# Patient Record
Sex: Male | Born: 1985 | Race: White | Hispanic: No | Marital: Single | Smoking: Former smoker
Health system: Southern US, Community
[De-identification: ages and names within clinical notes are randomized; demographics above are authoritative.]

---
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
## 2005-07-07 ENCOUNTER — Encounter: Payer: Self-pay | Admitting: *Deleted

## 2009-09-08 ENCOUNTER — Emergency Department (HOSPITAL_COMMUNITY): Admission: EM | Admit: 2009-09-08 | Discharge: 2009-09-08 | Payer: Self-pay | Admitting: Emergency Medicine

## 2010-06-01 ENCOUNTER — Emergency Department (HOSPITAL_COMMUNITY): Payer: No Typology Code available for payment source

## 2010-06-01 ENCOUNTER — Emergency Department (HOSPITAL_COMMUNITY)
Admission: EM | Admit: 2010-06-01 | Discharge: 2010-06-01 | Disposition: A | Discharge status: Home / Self Care | Payer: No Typology Code available for payment source | Attending: Emergency Medicine | Admitting: Emergency Medicine

## 2010-06-01 DIAGNOSIS — R071 Chest pain on breathing: Secondary | ICD-10-CM | POA: Insufficient documentation

## 2010-06-01 DIAGNOSIS — IMO0002 Reserved for concepts with insufficient information to code with codable children: Secondary | ICD-10-CM | POA: Insufficient documentation

## 2010-06-01 DIAGNOSIS — IMO0001 Reserved for inherently not codable concepts without codable children: Secondary | ICD-10-CM | POA: Insufficient documentation

## 2010-06-01 DIAGNOSIS — M25519 Pain in unspecified shoulder: Secondary | ICD-10-CM | POA: Insufficient documentation

## 2010-06-01 DIAGNOSIS — T148XXA Other injury of unspecified body region, initial encounter: Secondary | ICD-10-CM | POA: Insufficient documentation

## 2010-06-01 DIAGNOSIS — R0789 Other chest pain: Secondary | ICD-10-CM | POA: Insufficient documentation

## 2010-06-24 ENCOUNTER — Other Ambulatory Visit: Payer: Self-pay | Admitting: Orthopedic Surgery

## 2010-06-24 DIAGNOSIS — R52 Pain, unspecified: Secondary | ICD-10-CM

## 2010-06-25 ENCOUNTER — Inpatient Hospital Stay: Admission: RE | Admit: 2010-06-25 | Payer: No Typology Code available for payment source | Source: Ambulatory Visit

## 2010-06-26 ENCOUNTER — Other Ambulatory Visit: Payer: No Typology Code available for payment source

## 2010-07-08 ENCOUNTER — Ambulatory Visit
Admission: RE | Admit: 2010-07-08 | Discharge: 2010-07-08 | Disposition: A | Discharge status: Home / Self Care | Payer: Federal, State, Local not specified - PPO | Source: Ambulatory Visit | Attending: Orthopedic Surgery | Admitting: Orthopedic Surgery

## 2010-07-08 DIAGNOSIS — R52 Pain, unspecified: Secondary | ICD-10-CM

## 2010-11-24 ENCOUNTER — Emergency Department (HOSPITAL_COMMUNITY)
Admission: EM | Admit: 2010-11-24 | Discharge: 2010-11-24 | Disposition: A | Discharge status: Home / Self Care | Payer: Federal, State, Local not specified - PPO | Attending: Emergency Medicine | Admitting: Emergency Medicine

## 2010-11-24 DIAGNOSIS — B029 Zoster without complications: Secondary | ICD-10-CM | POA: Insufficient documentation

## 2012-08-10 ENCOUNTER — Other Ambulatory Visit: Payer: Federal, State, Local not specified - PPO

## 2012-08-10 ENCOUNTER — Other Ambulatory Visit: Payer: Self-pay | Admitting: Family Medicine

## 2012-08-10 DIAGNOSIS — R109 Unspecified abdominal pain: Secondary | ICD-10-CM

## 2012-08-11 ENCOUNTER — Ambulatory Visit
Admission: RE | Admit: 2012-08-11 | Discharge: 2012-08-11 | Disposition: A | Discharge status: Home / Self Care | Payer: BC Managed Care – PPO | Source: Ambulatory Visit | Attending: Family Medicine | Admitting: Family Medicine

## 2012-08-11 DIAGNOSIS — R109 Unspecified abdominal pain: Secondary | ICD-10-CM

## 2017-12-02 ENCOUNTER — Ambulatory Visit: Payer: Self-pay | Admitting: Family Medicine

## 2018-01-21 ENCOUNTER — Encounter (HOSPITAL_COMMUNITY): Payer: Self-pay | Admitting: Emergency Medicine

## 2018-01-21 ENCOUNTER — Emergency Department (HOSPITAL_COMMUNITY): Payer: BLUE CROSS/BLUE SHIELD

## 2018-01-21 ENCOUNTER — Other Ambulatory Visit: Payer: Self-pay

## 2018-01-21 ENCOUNTER — Emergency Department (HOSPITAL_COMMUNITY)
Admission: EM | Admit: 2018-01-21 | Discharge: 2018-01-21 | Disposition: A | Discharge status: Home / Self Care | Payer: BLUE CROSS/BLUE SHIELD | Attending: Emergency Medicine | Admitting: Emergency Medicine

## 2018-01-21 DIAGNOSIS — R1031 Right lower quadrant pain: Secondary | ICD-10-CM | POA: Diagnosis not present

## 2018-01-21 DIAGNOSIS — M79604 Pain in right leg: Secondary | ICD-10-CM

## 2018-01-21 DIAGNOSIS — N200 Calculus of kidney: Secondary | ICD-10-CM | POA: Diagnosis not present

## 2018-01-21 LAB — URINALYSIS, ROUTINE W REFLEX MICROSCOPIC
Bilirubin Urine: NEGATIVE
GLUCOSE, UA: NEGATIVE mg/dL
HGB URINE DIPSTICK: NEGATIVE
KETONES UR: NEGATIVE mg/dL
LEUKOCYTES UA: NEGATIVE
Nitrite: NEGATIVE
PH: 6 (ref 5.0–8.0)
PROTEIN: NEGATIVE mg/dL
Specific Gravity, Urine: 1.015 (ref 1.005–1.030)

## 2018-01-21 MED ORDER — DEXAMETHASONE SODIUM PHOSPHATE 10 MG/ML IJ SOLN
10.0000 mg | Freq: Once | INTRAMUSCULAR | Status: AC
  Administered 2018-01-21: 10 mg via INTRAVENOUS
  Filled 2018-01-21: qty 1

## 2018-01-21 MED ORDER — KETOROLAC TROMETHAMINE 30 MG/ML IJ SOLN
30.0000 mg | Freq: Once | INTRAMUSCULAR | Status: AC
  Administered 2018-01-21: 30 mg via INTRAVENOUS
  Filled 2018-01-21: qty 1

## 2018-01-21 MED ORDER — PREDNISONE 20 MG PO TABS: 40.0000 mg | ORAL_TABLET | Freq: Every day | ORAL | 0 refills | Status: DC

## 2018-01-21 MED ORDER — FENTANYL CITRATE (PF) 100 MCG/2ML IJ SOLN
50.0000 ug | Freq: Once | INTRAMUSCULAR | Status: AC
  Administered 2018-01-21: 50 ug via INTRAVENOUS
  Filled 2018-01-21: qty 2

## 2018-01-21 MED ORDER — OXYCODONE-ACETAMINOPHEN 5-325 MG PO TABS
1.0000 | ORAL_TABLET | Freq: Once | ORAL | Status: AC
  Administered 2018-01-21: 1 via ORAL
  Filled 2018-01-21: qty 1

## 2018-01-21 MED ORDER — HYDROCODONE-ACETAMINOPHEN 5-325 MG PO TABS: 1.0000 | ORAL_TABLET | Freq: Four times a day (QID) | ORAL | 0 refills | Status: DC | PRN

## 2018-01-21 MED ORDER — SODIUM CHLORIDE 0.9 % IV BOLUS
1000.0000 mL | Freq: Once | INTRAVENOUS | Status: AC
  Administered 2018-01-21: 1000 mL via INTRAVENOUS

## 2018-01-21 NOTE — Discharge Instructions (Signed)
As discussed, your evaluation today has been largely reassuring.  But, it is important that you monitor your condition carefully, and do not hesitate to return to the ED if you develop new, or concerning changes in your condition. ? ?Otherwise, please follow-up with your physician for appropriate ongoing care. ? ?

## 2018-01-21 NOTE — ED Notes (Signed)
Pt transported to CT via stretcher bed at this time.  

## 2018-01-21 NOTE — ED Provider Notes (Signed)
[facility]-EMERGENCY DEPT Provider Note   CSN: [id number] Arrival date & time: 01/21/18  5188     History   Chief Complaint Chief Complaint  Patient presents with  . Back Pain    HPI Ronald Mahoney is a 32 y.o. male.  HPI Patient presents with right flank, inguinal pain. It is sore severe, and the patient is unable to obtain a position of comfort. Pain that radiates on the right leg, laterally. Patient is healthy, has no medical problems, states that he was well prior to the onset of illness which was about 1 day ago, but notably worse over the past day. No improvement with OTC anti-inflammatories. No urinary changes, scrotum pain, swelling. 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     Home Medications   No home meds  Past medical history: Kidney stone  Social history: Denies drug use, smoke cigarettes  Allergies   Patient has no known allergies.   Review of Systems Review of Systems  Constitutional:       Per HPI, otherwise negative  HENT:       Per HPI, otherwise negative  Respiratory:       Per HPI, otherwise negative  Cardiovascular:       Per HPI, otherwise negative  Gastrointestinal: Negative for vomiting.  Endocrine:       Negative aside from HPI  Genitourinary:       Neg aside from HPI   Musculoskeletal:       Per HPI, otherwise negative  Skin: Negative.   Neurological: Negative for syncope.     Physical Exam Updated Vital Signs BP (!) 152/64 (BP Location: Right Arm)   Pulse 61   Temp 98.9 F (37.2 C) (Oral)   Resp 17   Ht 6\' 5"  (1.956 m)   Wt 104.3 kg   SpO2 99%   BMI 27.27 kg/m   Physical Exam  Constitutional: He is oriented to person, place, and time. He appears well-developed. No distress.  Very uncomfortable appearing thin young male awake and alert  HENT:  Head: Normocephalic and atraumatic.  Eyes: Conjunctivae and EOM are normal.    Cardiovascular: Normal rate and regular rhythm.  Pulmonary/Chest: Effort normal. No stridor. No respiratory distress.  Abdominal: He exhibits no distension.    Musculoskeletal: He exhibits no edema.  Neurological: He is alert and oriented to person, place, and time.  She describes pain in the right leg, worse with motion, but does move the leg spontaneously, has no apparent range of motion limitation, nor any deformity.   Skin: Skin is warm and dry.  Psychiatric: He has a normal mood and affect.  Nursing note and vitals reviewed.    ED Treatments / Results  Labs (all labs ordered are listed, but only abnormal results are displayed) Labs Reviewed  URINALYSIS, ROUTINE W REFLEX MICROSCOPIC    Radiology Ct Renal Stone Study  Result Date: 01/21/2018 CLINICAL DATA:  Right-sided flank pain EXAM: CT ABDOMEN AND PELVIS WITHOUT CONTRAST TECHNIQUE: Multidetector CT imaging of the abdomen and pelvis was performed following the standard protocol without IV contrast. COMPARISON:  08/11/2012 FINDINGS: Lower chest: No acute abnormality. Hepatobiliary: No focal liver abnormality is seen. No gallstones, gallbladder wall thickening, or biliary dilatation. Pancreas: Unremarkable. No pancreatic ductal dilatation or surrounding inflammatory changes. Spleen: Normal in size without focal abnormality. Adrenals/Urinary Tract: Adrenal glands are within normal limits. Kidneys demonstrate evidence of a tiny nonobstructing right renal stone.  Ureters are within normal limits. Bladder is partially distended. Stomach/Bowel: The appendix is well visualized and within normal limits. No obstructive or inflammatory changes of the bowel are seen. No gastric abnormality is noted. Vascular/Lymphatic: No significant vascular findings are present. No enlarged abdominal or pelvic lymph nodes. Reproductive: Prostate is unremarkable. Other: No abdominal wall hernia or abnormality. No abdominopelvic ascites. Musculoskeletal: No acute  or significant osseous findings. IMPRESSION: Tiny nonobstructing right renal stone. Normal-appearing appendix. No other focal abnormality is seen. Electronically Signed   By: Alcide CleverMark  Lukens M.D.   On: 01/21/2018 10:31    Procedures Procedures (including critical care time)  Medications Ordered in ED Medications  sodium chloride 0.9 % bolus 1,000 mL (1,000 mLs Intravenous Bolus 01/21/18 1010)  ketorolac (TORADOL) 30 MG/ML injection 30 mg (30 mg Intravenous Given 01/21/18 1010)  fentaNYL (SUBLIMAZE) injection 50 mcg (50 mcg Intravenous Given 01/21/18 1011)  oxyCODONE-acetaminophen (PERCOCET/ROXICET) 5-325 MG per tablet 1 tablet (1 tablet Oral Given 01/21/18 1156)     Initial Impression / Assessment and Plan / ED Course  I have reviewed the triage vital signs and the nursing notes.  Pertinent labs & imaging results that were available during my care of the patient were reviewed by me and considered in my medical decision making (see chart for details).     Initial urinalysis reassuring, given the patient's history of kidney stone, CT scan performed  2:22 PM Vision is awake and alert, hemodynamically unremarkable, though he has some discomfort, he is better than on arrival. No new complaints, no fever CT notable for kidney stone without obstruction, infection. Patient has unusual description of pain for kidney stone, there is some suspicion for sciatic irritation, this may be secondary to the patient's occupation, moving heavy objects. Patient has an improvement here, will require additional analgesia on discharge, outpatient follow-up.   Final Clinical Impressions(s) / ED Diagnoses  Kidney stones   Gerhard MunchLockwood, Chenae Brager, MD 01/21/18 1425

## 2018-01-21 NOTE — ED Triage Notes (Addendum)
Pt presents to ER via POV complaining of lower back pain x 2 days. Pt states the pain is 10/10 on the right side and radiates down to his Right calf muscle. Pt describes the pain as a stretching sensation. Pain is slightly relieved by standing and increased while walking. Pt admits to SOB and denies CP, N/V/D, or and difficulty with urine or stool. Pt states he took a anti-inflammatory and a muscle relaxer this morning without relief.

## 2018-01-21 NOTE — ED Notes (Signed)
Pt returned to room from CT scanner at this time.

## 2018-01-21 NOTE — ED Notes (Signed)
Provider at bedside at this time

## 2018-03-17 DIAGNOSIS — R030 Elevated blood-pressure reading, without diagnosis of hypertension: Secondary | ICD-10-CM | POA: Diagnosis not present

## 2018-03-17 DIAGNOSIS — M545 Low back pain: Secondary | ICD-10-CM | POA: Diagnosis not present

## 2018-07-05 ENCOUNTER — Ambulatory Visit (INDEPENDENT_AMBULATORY_CARE_PROVIDER_SITE_OTHER): Payer: BLUE CROSS/BLUE SHIELD | Admitting: Family Medicine

## 2018-07-05 ENCOUNTER — Encounter: Payer: Self-pay | Admitting: Family Medicine

## 2018-07-05 VITALS — Ht 77.0 in

## 2018-07-05 DIAGNOSIS — A6001 Herpesviral infection of penis: Secondary | ICD-10-CM | POA: Insufficient documentation

## 2018-07-05 MED ORDER — VALACYCLOVIR HCL 1 G PO TABS: 500.0000 mg | ORAL_TABLET | Freq: Two times a day (BID) | ORAL | 1 refills | Status: DC

## 2018-07-05 NOTE — Progress Notes (Signed)
New Patient Office Visit  Subjective:  Patient ID: Ronald Mahoney, male    DOB: 03/19/1985  Age: 33 y.o. MRN: [id number]  CC:  Chief Complaint  Patient presents with  . Establish Care  . Medication Refill    HPI Ronald StacksManuel Mahoney presents for establishment of care by telemedicine today.  Patient has enjoyed good health.  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  He developed that this infection at age 33.  He perhaps has 4 5 outbreaks yearly.  He works as a Art gallery managerprinting technician.  He lives with his significant other and 33-year-old daughter.  They are currently working 31 hours weekly.  He drinks alcohol twice a week and has anywhere from 4-6 beers.  He quit smoking some years ago and smokes marijuana on occasion.  Mom is [id number] and dad is 7561.  He believes that they are in relatively good health but are overweight.  History reviewed. No pertinent past medical history.  History reviewed. No pertinent surgical history.  History reviewed. No pertinent family history.  Social History   Socioeconomic History  . Marital status: Single    Spouse name: Not on file  . Number of children: Not on file  . Years of education: Not on file  . Highest education level: Not on file  Occupational History  . Not on file  Social Needs  . Financial resource strain: Not on file  . Food insecurity:    Worry: Not on file    Inability: Not on file  . Transportation needs:    Medical: Not on file    Non-medical: Not on file  Tobacco Use  . Smoking status: Former Games developermoker  . Smokeless tobacco: Never Used  Substance and Sexual Activity  . Alcohol use: Not on file  . Drug use: Yes    Types: Marijuana  . Sexual activity: Not on file  Lifestyle  . Physical activity:    Days per week: Not on file    Minutes per session: Not on file  . Stress: Not on file  Relationships  . Social connections:    Talks on phone: Not on file    Gets together: Not on file   Attends religious service: Not on file    Active member of club or organization: Not on file    Attends meetings of clubs or organizations: Not on file    Relationship status: Not on file  . Intimate partner violence:    Fear of current or ex partner: Not on file    Emotionally abused: Not on file    Physically abused: Not on file    Forced sexual activity: Not on file  Other Topics Concern  . Not on file  Social History Narrative  . Not on file    ROS Review of Systems  Constitutional: Negative.   Respiratory: Negative.   Cardiovascular: Negative.   Gastrointestinal: Negative.   Skin: Positive for color change and rash.  Psychiatric/Behavioral: Negative.     Objective:   Today's Vitals: Ht 6\' 5"  (1.956 m)   BMI 27.27 kg/m   Physical Exam Constitutional:      General: He is not in acute distress. Pulmonary:     Effort: Pulmonary effort is normal.  Neurological:     Mental Status: He is alert and oriented to person, place, and time.  Psychiatric:        Mood and Affect: Mood normal.  Behavior: Behavior normal.     Assessment & Plan:   Problem List Items Addressed This Visit    None      Outpatient Encounter Medications as of 07/05/2018  Medication Sig  . [DISCONTINUED] HYDROcodone-acetaminophen (NORCO/VICODIN) 5-325 MG tablet Take 1 tablet by mouth every 6 (six) hours as needed for severe pain.  . [DISCONTINUED] predniSONE (DELTASONE) 20 MG tablet Take 2 tablets (40 mg total) by mouth daily with breakfast. For the next four days   No facility-administered encounter medications on file as of 07/05/2018.     Follow-up: No follow-ups on file.   Mliss Sax, MDVirtual Visit via Video Note  I connected with Ronald Mahoney on 07/05/18 at  9:00 AM EDT by a video enabled telemedicine application and verified that I am speaking with the correct person using two identifiers.   I discussed the limitations of evaluation and management by telemedicine  and the availability of in person appointments. The patient expressed understanding and agreed to proceed.  History of Present Illness:    Observations/Objective:   Assessment and Plan:   Follow Up Instructions:    I discussed the assessment and treatment plan with the patient. The patient was provided an opportunity to ask questions and all were answered. The patient agreed with the plan and demonstrated an understanding of the instructions.   The patient was advised to call back or seek an in-person evaluation if the symptoms worsen or if the condition fails to improve as anticipated.  Interactive video and audio telecommunications were attempted between myself and the patient. However they failed due to the patient having technical difficulties or not having access to video capability. We continued and completed with audio only.  I provided 15 minutes of non-face-to-face time during this encounter.  Patient will follow-up later this summer for a face-to-face complete physical.

## 2018-09-02 ENCOUNTER — Other Ambulatory Visit: Payer: Self-pay | Admitting: *Deleted

## 2018-09-02 DIAGNOSIS — R6889 Other general symptoms and signs: Secondary | ICD-10-CM | POA: Diagnosis not present

## 2018-09-02 DIAGNOSIS — Z20822 Contact with and (suspected) exposure to covid-19: Secondary | ICD-10-CM

## 2018-09-08 LAB — NOVEL CORONAVIRUS, NAA: SARS-CoV-2, NAA: NOT DETECTED

## 2018-12-01 ENCOUNTER — Other Ambulatory Visit: Payer: Self-pay

## 2018-12-01 DIAGNOSIS — R6889 Other general symptoms and signs: Secondary | ICD-10-CM | POA: Diagnosis not present

## 2018-12-01 DIAGNOSIS — Z20822 Contact with and (suspected) exposure to covid-19: Secondary | ICD-10-CM

## 2018-12-02 LAB — NOVEL CORONAVIRUS, NAA: SARS-CoV-2, NAA: NOT DETECTED

## 2019-05-18 ENCOUNTER — Other Ambulatory Visit: Payer: Self-pay | Admitting: Family Medicine

## 2019-05-18 DIAGNOSIS — A6001 Herpesviral infection of penis: Secondary | ICD-10-CM

## 2019-07-09 DIAGNOSIS — L638 Other alopecia areata: Secondary | ICD-10-CM | POA: Diagnosis not present

## 2019-09-06 ENCOUNTER — Encounter (HOSPITAL_COMMUNITY): Payer: Self-pay

## 2019-09-06 ENCOUNTER — Emergency Department (HOSPITAL_COMMUNITY): Payer: Self-pay

## 2019-09-06 ENCOUNTER — Emergency Department (HOSPITAL_COMMUNITY)
Admission: EM | Admit: 2019-09-06 | Discharge: 2019-09-06 | Disposition: A | Discharge status: Home / Self Care | Payer: Self-pay | Attending: Emergency Medicine | Admitting: Emergency Medicine

## 2019-09-06 DIAGNOSIS — K5730 Diverticulosis of large intestine without perforation or abscess without bleeding: Secondary | ICD-10-CM | POA: Diagnosis not present

## 2019-09-06 DIAGNOSIS — R1031 Right lower quadrant pain: Secondary | ICD-10-CM | POA: Insufficient documentation

## 2019-09-06 DIAGNOSIS — Z87442 Personal history of urinary calculi: Secondary | ICD-10-CM | POA: Insufficient documentation

## 2019-09-06 DIAGNOSIS — Z87891 Personal history of nicotine dependence: Secondary | ICD-10-CM | POA: Insufficient documentation

## 2019-09-06 DIAGNOSIS — R109 Unspecified abdominal pain: Secondary | ICD-10-CM

## 2019-09-06 DIAGNOSIS — N202 Calculus of kidney with calculus of ureter: Secondary | ICD-10-CM | POA: Diagnosis not present

## 2019-09-06 LAB — COMPREHENSIVE METABOLIC PANEL
ALT: 29 U/L (ref 0–44)
AST: 26 U/L (ref 15–41)
Albumin: 4.1 g/dL (ref 3.5–5.0)
Alkaline Phosphatase: 74 U/L (ref 38–126)
Anion gap: 8 (ref 5–15)
BUN: 13 mg/dL (ref 6–20)
CO2: 24 mmol/L (ref 22–32)
Calcium: 9.1 mg/dL (ref 8.9–10.3)
Chloride: 107 mmol/L (ref 98–111)
Creatinine, Ser: 1.02 mg/dL (ref 0.61–1.24)
GFR calc Af Amer: >60 mL/min (ref >60)
GFR calc non Af Amer: >60 mL/min (ref >60)
Glucose, Bld: 85 mg/dL (ref 70–99)
Potassium: 4 mmol/L (ref 3.5–5.1)
Sodium: 139 mmol/L (ref 135–145)
Total Bilirubin: 0.8 mg/dL (ref 0.3–1.2)
Total Protein: 6.7 g/dL (ref 6.5–8.1)

## 2019-09-06 LAB — CBC
HCT: 46.6 % (ref 39.0–52.0)
Hemoglobin: 15.4 g/dL (ref 13.0–17.0)
MCH: 30.3 pg (ref 26.0–34.0)
MCHC: 33 g/dL (ref 30.0–36.0)
MCV: 91.6 fL (ref 80.0–100.0)
Platelets: 286 10*3/uL (ref 150–400)
RBC: 5.09 MIL/uL (ref 4.22–5.81)
RDW: 12.1 % (ref 11.5–15.5)
WBC: 7.1 10*3/uL (ref 4.0–10.5)
nRBC: 0 % (ref 0.0–0.2)

## 2019-09-06 LAB — URINALYSIS, ROUTINE W REFLEX MICROSCOPIC
Bilirubin Urine: NEGATIVE
Glucose, UA: NEGATIVE mg/dL
Hgb urine dipstick: NEGATIVE
Ketones, ur: NEGATIVE mg/dL
Leukocytes,Ua: NEGATIVE
Nitrite: NEGATIVE
Protein, ur: NEGATIVE mg/dL
Specific Gravity, Urine: 1.019 (ref 1.005–1.030)
pH: 5 (ref 5.0–8.0)

## 2019-09-06 LAB — LIPASE, BLOOD: Lipase: 31 U/L (ref 11–51)

## 2019-09-06 MED ORDER — KETOROLAC TROMETHAMINE 30 MG/ML IJ SOLN
15.0000 mg | Freq: Once | INTRAMUSCULAR | Status: AC
  Administered 2019-09-06: 15 mg via INTRAMUSCULAR
  Filled 2019-09-06: qty 1

## 2019-09-06 MED ORDER — ACETAMINOPHEN 500 MG PO TABS
1000.0000 mg | ORAL_TABLET | Freq: Once | ORAL | Status: AC
  Administered 2019-09-06: 1000 mg via ORAL
  Filled 2019-09-06: qty 2

## 2019-09-06 MED ORDER — OXYCODONE-ACETAMINOPHEN 5-325 MG PO TABS
1.0000 | ORAL_TABLET | Freq: Once | ORAL | Status: AC
  Administered 2019-09-06: 1 via ORAL
  Filled 2019-09-06: qty 1
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
## 2019-09-06 NOTE — ED Notes (Signed)
Patient verbalizes understanding of discharge instructions. Opportunity for questioning and answers were provided. Armband removed by staff, pt discharged from ED.  
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
## 2019-09-06 NOTE — Discharge Instructions (Signed)
Per our discussion, I would recommend taking ibuprofen and/or Tylenol as needed for management of your pain.  I would recommend 400 mg of ibuprofen 2-3 times a day.  Do not take this on an empty stomach.  If your symptoms worsen feel free to return to the emergency department.  I would recommend following up with your primary care provider in 1 week if your symptoms have not resolved.  It was a pleasure to meet you.

## 2019-10-04 DIAGNOSIS — L638 Other alopecia areata: Secondary | ICD-10-CM | POA: Diagnosis not present

## 2019-10-22 ENCOUNTER — Telehealth: Payer: Self-pay | Admitting: Family Medicine

## 2019-10-22 DIAGNOSIS — A6001 Herpesviral infection of penis: Secondary | ICD-10-CM

## 2019-10-22 NOTE — Telephone Encounter (Signed)
Patient called to get a refill on his Valacyclovir. If approved, he needs it sent to [facility] on [address]. Please call him back at [phone] to let him know that it has been sent in.

## 2019-10-22 NOTE — Telephone Encounter (Signed)
Patient calling for Rx on Valacyclovir notes does not show previous Rx for medication patient has an upcoming visit in September last OV in April 2021. Please advise.

## 2019-10-23 MED ORDER — VALACYCLOVIR HCL 1 G PO TABS: 500.0000 mg | ORAL_TABLET | Freq: Two times a day (BID) | ORAL | 2 refills | Status: AC

## 2019-11-22 ENCOUNTER — Other Ambulatory Visit: Payer: Self-pay

## 2019-11-23 ENCOUNTER — Encounter: Payer: Self-pay | Admitting: Family Medicine

## 2019-11-23 ENCOUNTER — Ambulatory Visit (INDEPENDENT_AMBULATORY_CARE_PROVIDER_SITE_OTHER): Payer: BC Managed Care – PPO | Admitting: Family Medicine

## 2019-11-23 VITALS — BP 116/72 | HR 69 | Temp 98.2°F | Ht 76.0 in | Wt 235.0 lb

## 2019-11-23 DIAGNOSIS — Z0000 Encounter for general adult medical examination without abnormal findings: Secondary | ICD-10-CM

## 2019-11-23 DIAGNOSIS — L639 Alopecia areata, unspecified: Secondary | ICD-10-CM | POA: Diagnosis not present

## 2019-11-23 DIAGNOSIS — Z13220 Encounter for screening for lipoid disorders: Secondary | ICD-10-CM | POA: Diagnosis not present

## 2019-11-23 NOTE — Patient Instructions (Signed)
Health Maintenance, Male Adopting a healthy lifestyle and getting preventive care are important in promoting health and wellness. Ask your health care provider about:  The right schedule for you to have regular tests and exams.  Things you can do on your own to prevent diseases and keep yourself healthy. What should I know about diet, weight, and exercise? Eat a healthy diet   Eat a diet that includes plenty of vegetables, fruits, low-fat dairy products, and lean protein.  Do not eat a lot of foods that are high in solid fats, added sugars, or sodium. Maintain a healthy weight Body mass index (BMI) is a measurement that can be used to identify possible weight problems. It estimates body fat based on height and weight. Your health care provider can help determine your BMI and help you achieve or maintain a healthy weight. Get regular exercise Get regular exercise. This is one of the most important things you can do for your health. Most adults should:  Exercise for at least 150 minutes each week. The exercise should increase your heart rate and make you sweat (moderate-intensity exercise).  Do strengthening exercises at least twice a week. This is in addition to the moderate-intensity exercise.  Spend less time sitting. Even light physical activity can be beneficial. Watch cholesterol and blood lipids Have your blood tested for lipids and cholesterol at 34 years of age, then have this test every 5 years. You may need to have your cholesterol levels checked more often if:  Your lipid or cholesterol levels are high.  You are older than 34 years of age.  You are at high risk for heart disease. What should I know about cancer screening? Many types of cancers can be detected early and may often be prevented. Depending on your health history and family history, you may need to have cancer screening at various ages. This may include screening for:  Colorectal cancer.  Prostate  cancer.  Skin cancer.  Lung cancer. What should I know about heart disease, diabetes, and high blood pressure? Blood pressure and heart disease  High blood pressure causes heart disease and increases the risk of stroke. This is more likely to develop in people who have high blood pressure readings, are of African descent, or are overweight.  Talk with your health care provider about your target blood pressure readings.  Have your blood pressure checked: ? Every 3-5 years if you are 18-39 years of age. ? Every year if you are 40 years old or older.  If you are between the ages of 65 and 75 and are a current or former smoker, ask your health care provider if you should have a one-time screening for abdominal aortic aneurysm (AAA). Diabetes Have regular diabetes screenings. This checks your fasting blood sugar level. Have the screening done:  Once every three years after age 45 if you are at a normal weight and have a low risk for diabetes.  More often and at a younger age if you are overweight or have a high risk for diabetes. What should I know about preventing infection? Hepatitis B If you have a higher risk for hepatitis B, you should be screened for this virus. Talk with your health care provider to find out if you are at risk for hepatitis B infection. Hepatitis C Blood testing is recommended for:  Everyone born from 1945 through 1965.  Anyone with known risk factors for hepatitis C. Sexually transmitted infections (STIs)  You should be screened each year   for STIs, including gonorrhea and chlamydia, if: ? You are sexually active and are younger than 34 years of age. ? You are older than 34 years of age and your health care provider tells you that you are at risk for this type of infection. ? Your sexual activity has changed since you were last screened, and you are at increased risk for chlamydia or gonorrhea. Ask your health care provider if you are at risk.  Ask your  health care provider about whether you are at high risk for HIV. Your health care provider may recommend a prescription medicine to help prevent HIV infection. If you choose to take medicine to prevent HIV, you should first get tested for HIV. You should then be tested every 3 months for as long as you are taking the medicine. Follow these instructions at home: Lifestyle  Do not use any products that contain nicotine or tobacco, such as cigarettes, e-cigarettes, and chewing tobacco. If you need help quitting, ask your health care provider.  Do not use street drugs.  Do not share needles.  Ask your health care provider for help if you need support or information about quitting drugs. Alcohol use  Do not drink alcohol if your health care provider tells you not to drink.  If you drink alcohol: ? Limit how much you have to 0-2 drinks a day. ? Be aware of how much alcohol is in your drink. In the [place] one drink equals one 12 oz bottle of beer (355 mL), one 5 oz glass of wine (148 mL), or one 1 oz glass of hard liquor (44 mL). General instructions  Schedule regular health, dental, and eye exams.  Stay current with your vaccines.  Tell your health care provider if: ? You often feel depressed. ? You have ever been abused or do not feel safe at home. Summary  Adopting a healthy lifestyle and getting preventive care are important in promoting health and wellness.  Follow your health care provider's instructions about healthy diet, exercising, and getting tested or screened for diseases.  Follow your health care provider's instructions on monitoring your cholesterol and blood pressure. This information is not intended to replace advice given to you by your health care provider. Make sure you discuss any questions you have with your health care provider. Document Revised: 02/15/2018 Document Reviewed: 02/15/2018 Elsevier Patient Education  2020 [employer]  Preventive Care 21-39 Years  Old, Male Preventive care refers to lifestyle choices and visits with your health care provider that can promote health and wellness. This includes:  A yearly physical exam. This is also called an annual well check.  Regular dental and eye exams.  Immunizations.  Screening for certain conditions.  Healthy lifestyle choices, such as eating a healthy diet, getting regular exercise, not using drugs or products that contain nicotine and tobacco, and limiting alcohol use. What can I expect for my preventive care visit? Physical exam Your health care provider will check:  Height and weight. These may be used to calculate body mass index (BMI), which is a measurement that tells if you are at a healthy weight.  Heart rate and blood pressure.  Your skin for abnormal spots. Counseling Your health care provider may ask you questions about:  Alcohol, tobacco, and drug use.  Emotional well-being.  Home and relationship well-being.  Sexual activity.  Eating habits.  Work and work environment. What immunizations do I need?  Influenza (flu) vaccine  This is recommended every year. Tetanus, diphtheria,   and pertussis (Tdap) vaccine  You may need a Td booster every 10 years. Varicella (chickenpox) vaccine  You may need this vaccine if you have not already been vaccinated. Human papillomavirus (HPV) vaccine  If recommended by your health care provider, you may need three doses over 6 months. Measles, mumps, and rubella (MMR) vaccine  You may need at least one dose of MMR. You may also need a second dose. Meningococcal conjugate (MenACWY) vaccine  One dose is recommended if you are 73-28 years old and a Market researcher living in a residence hall, or if you have one of several medical conditions. You may also need additional booster doses. Pneumococcal conjugate (PCV13) vaccine  You may need this if you have certain conditions and were not previously  vaccinated. Pneumococcal polysaccharide (PPSV23) vaccine  You may need one or two doses if you smoke cigarettes or if you have certain conditions. Hepatitis A vaccine  You may need this if you have certain conditions or if you travel or work in places where you may be exposed to hepatitis A. Hepatitis B vaccine  You may need this if you have certain conditions or if you travel or work in places where you may be exposed to hepatitis B. Haemophilus influenzae type b (Hib) vaccine  You may need this if you have certain risk factors. You may receive vaccines as individual doses or as more than one vaccine together in one shot (combination vaccines). Talk with your health care provider about the risks and benefits of combination vaccines. What tests do I need? Blood tests  Lipid and cholesterol levels. These may be checked every 5 years starting at age 80.  Hepatitis C test.  Hepatitis B test. Screening   Diabetes screening. This is done by checking your blood sugar (glucose) after you have not eaten for a while (fasting).  Sexually transmitted disease (STD) testing. Talk with your health care provider about your test results, treatment options, and if necessary, the need for more tests. Follow these instructions at home: Eating and drinking   Eat a diet that includes fresh fruits and vegetables, whole grains, lean protein, and low-fat dairy products.  Take vitamin and mineral supplements as recommended by your health care provider.  Do not drink alcohol if your health care provider tells you not to drink.  If you drink alcohol: ? Limit how much you have to 0-2 drinks a day. ? Be aware of how much alcohol is in your drink. In the [place] one drink equals one 12 oz bottle of beer (355 mL), one 5 oz glass of wine (148 mL), or one 1 oz glass of hard liquor (44 mL). Lifestyle  Take daily care of your teeth and gums.  Stay active. Exercise for at least 30 minutes on 5 or more days  each week.  Do not use any products that contain nicotine or tobacco, such as cigarettes, e-cigarettes, and chewing tobacco. If you need help quitting, ask your health care provider.  If you are sexually active, practice safe sex. Use a condom or other form of protection to prevent STIs (sexually transmitted infections). What's next?  Go to your health care provider once a year for a well check visit.  Ask your health care provider how often you should have your eyes and teeth checked.  Stay up to date on all vaccines. This information is not intended to replace advice given to you by your health care provider. Make sure you discuss any questions you  have with your health care provider. Document Revised: 02/16/2018 Document Reviewed: 02/16/2018 Elsevier Patient Education  [place].  Alopecia Areata, Adult  Alopecia areata is a condition that causes you to lose hair. You may lose hair on your scalp in patches. In some cases, you may lose all the hair on your scalp (alopecia totalis) or all the hair from your face and body (alopecia universalis). Alopecia areata is an autoimmune disease. This means that your body's defense system (immune system) mistakes normal parts of the body for germs or other things that can make you sick. When you have alopecia areata, the immune system attacks the hair follicles. Alopecia areata usually develops in childhood, but it can develop at any age. For some people, their hair grows back on its own and hair loss does not happen again. For others, their hair may fall out and grow back in cycles. The hair loss may last many years. Having this condition can be emotionally difficult, but it is not dangerous. What are the causes? The cause of this condition is not known. What increases the risk? This condition is more likely to develop in people who have:  A family history of alopecia.  A family history of another autoimmune disease, including type 1  diabetes and rheumatoid arthritis.  Asthma and allergies.  Down syndrome. What are the signs or symptoms? Round spots of patchy hair loss on the scalp is the main symptom of this condition. The spots may be mildly itchy. Other symptoms include:  Short dark hairs in the bald patches that are wider at the top (exclamation point hairs).  Dents, white spots, or lines in the fingernails or toenails.  Balding and body hair loss. This is rare. How is this diagnosed? This condition is diagnosed based on your symptoms and family history. Your health care provider will also check your scalp skin, teeth, and nails. Your health care provider may refer you to a specialist in hair and skin disorders (dermatologist). You may also have tests, including:  A hair pull test.  Blood tests or other screening tests to check for autoimmune diseases, such as thyroid disease or diabetes.  Skin biopsy to confirm the diagnosis.  A procedure to examine the skin with a lighted magnifying instrument (dermoscopy). How is this treated? There is no cure for alopecia areata. Treatment is aimed at promoting the regrowth of hair and preventing the immune system from overreacting. No single treatment is right for all people with alopecia areata. It depends on the type of hair loss you have and how severe it is. Work with your health care provider to find the best treatment for you. Treatment may include:  Having regular checkups to make sure the condition is not getting worse (watchful waiting).  Steroid creams or pills for 6-8 weeks to stop the immune reaction and help hair to regrow more quickly.  Other topical medicines to alter the immune system response and support the hair growth cycle.  Steroid injections.  Therapy and counseling with a support group or therapist if you are having trouble coping with hair loss. Follow these instructions at home:  Learn as much as you can about your condition.  Apply topical  creams only as told by your health care provider.  Take over-the-counter and prescription medicines only as told by your health care provider.  Consider getting a wig or products to make hair look fuller or to cover bald spots, if you feel uncomfortable with your appearance.  Get  therapy or counseling if you are having a hard time coping with hair loss. Ask your health care provider to recommend a counselor or support group.  Keep all follow-up visits as told by your health care provider. This is important. Contact a health care provider if:  Your hair loss gets worse, even with treatment.  You have new symptoms.  You are struggling emotionally. Summary  Alopecia areata is an autoimmune condition that makes your body's defense system (immune system) attack the hair follicles. This causes you to lose hair.  Treatments may include regular checkups to make sure that the condition is not getting worse (watchful waiting), medicines, and steroid injections. This information is not intended to replace advice given to you by your health care provider. Make sure you discuss any questions you have with your health care provider. Document Revised: 02/04/2017 Document Reviewed: 03/12/2016 Elsevier Patient Education  2020 [employer].

## 2019-11-23 NOTE — Progress Notes (Signed)
New Patient Office Visit  Subjective:  Patient ID: Ronald Mahoney, male    DOB: 03/13/1985  Age: 34 y.o. MRN: [id number]  CC:  Chief Complaint  Patient presents with  . Establish Care    New patient CPE no concerns. Fasting for labs.     HPI Ronald Mahoney presents for a physical exam.  He is fasting.  He is doing well.  He has no concerns or complaints at this time.  He is suffering from alopecia.  He also has a family history of alopecia.  No past medical history on file.  No past surgical history on file.  Family History  Problem Relation Age of Onset  . Healthy Mother   . Healthy Father     Social History   Socioeconomic History  . Marital status: Single    Spouse name: Not on file  . Number of children: Not on file  . Years of education: Not on file  . Highest education level: Not on file  Occupational History  . Not on file  Tobacco Use  . Smoking status: Former Games developer  . Smokeless tobacco: Never Used  Vaping Use  . Vaping Use: Never used  Substance and Sexual Activity  . Alcohol use: Yes    Comment: 4-6 beers twice weekly  . Drug use: Yes    Types: Marijuana  . Sexual activity: Yes  Other Topics Concern  . Not on file  Social History Narrative  . Not on file   Social Determinants of Health   Financial Resource Strain:   . Difficulty of Paying Living Expenses: Not on file  Food Insecurity:   . Worried About Programme researcher, broadcasting/film/video in the Last Year: Not on file  . Ran Out of Food in the Last Year: Not on file  Transportation Needs:   . Lack of Transportation (Medical): Not on file  . Lack of Transportation (Non-Medical): Not on file  Physical Activity:   . Days of Exercise per Week: Not on file  . Minutes of Exercise per Session: Not on file  Stress:   . Feeling of Stress : Not on file  Social Connections:   . Frequency of Communication with Friends and Family: Not on file  . Frequency of Social Gatherings with Friends and Family: Not on file  .  Attends Religious Services: Not on file  . Active Member of Clubs or Organizations: Not on file  . Attends Banker Meetings: Not on file  . Marital Status: Not on file  Intimate Partner Violence:   . Fear of Current or Ex-Partner: Not on file  . Emotionally Abused: Not on file  . Physically Abused: Not on file  . Sexually Abused: Not on file    ROS Review of Systems  Constitutional: Negative.   HENT: Negative.   Eyes: Negative for photophobia and visual disturbance.  Respiratory: Negative.   Cardiovascular: Negative.   Gastrointestinal: Negative.   Endocrine: Negative for polyphagia and polyuria.  Genitourinary: Negative.   Musculoskeletal: Negative for gait problem and joint swelling.  Skin: Negative for pallor and rash.  Allergic/Immunologic: Negative for immunocompromised state.  Neurological: Negative.   Hematological: Negative.   Psychiatric/Behavioral: Negative.     Objective:   Today's Vitals: BP 116/72   Pulse 69   Temp 98.2 F (36.8 C) (Tympanic)   Ht 6\' 4"  (1.93 m) Comment: 6 4 1/2  Wt 235 lb (106.6 kg)   SpO2 96%   BMI 28.61 kg/m  Physical Exam Vitals and nursing note reviewed.  Constitutional:      General: He is not in acute distress.    Appearance: Normal appearance. He is not ill-appearing, toxic-appearing or diaphoretic.  HENT:     Head: Normocephalic and atraumatic.     Right Ear: Tympanic membrane, ear canal and external ear normal.     Left Ear: Tympanic membrane, ear canal and external ear normal.     Mouth/Throat:     Mouth: Mucous membranes are moist.     Pharynx: Oropharynx is clear. No oropharyngeal exudate or posterior oropharyngeal erythema.  Eyes:     General: No scleral icterus.       Right eye: No discharge.        Left eye: No discharge.     Extraocular Movements: Extraocular movements intact.     Conjunctiva/sclera: Conjunctivae normal.     Pupils: Pupils are equal, round, and reactive to light.  Cardiovascular:       Rate and Rhythm: Normal rate and regular rhythm.  Pulmonary:     Effort: Pulmonary effort is normal.     Breath sounds: Normal breath sounds.  Abdominal:     General: Abdomen is flat. Bowel sounds are normal. There is no distension.     Palpations: Abdomen is soft. There is no mass.     Tenderness: There is no abdominal tenderness. There is no guarding or rebound.     Hernia: No hernia is present. There is no hernia in the left inguinal area or right inguinal area.  Genitourinary:    Penis: No hypospadias, erythema, tenderness, discharge, swelling or lesions.      Testes:        Right: Mass, tenderness or swelling not present. Right testis is descended.        Left: Mass, tenderness or swelling not present. Left testis is descended.     Epididymis:     Right: Not inflamed or enlarged. No mass.     Left: Not inflamed or enlarged. No mass.  Musculoskeletal:     Cervical back: Normal range of motion. No rigidity or tenderness.     Right lower leg: No edema.     Left lower leg: No edema.  Lymphadenopathy:     Cervical: No cervical adenopathy.     Lower Body: No right inguinal adenopathy. No left inguinal adenopathy.  Skin:    General: Skin is warm and dry.  Neurological:     Mental Status: He is alert and oriented to person, place, and time.  Psychiatric:        Mood and Affect: Mood normal.        Behavior: Behavior normal.     Assessment & Plan:   Problem List Items Addressed This Visit      Musculoskeletal and Integument   Alopecia areata     Other   Healthcare maintenance - Primary   Relevant Orders   CBC   Comprehensive metabolic panel   Lipid panel   HIV Antibody (routine testing w rflx)   Urinalysis, Routine w reflex microscopic      Outpatient Encounter Medications as of 11/23/2019  Medication Sig  . valACYclovir (VALTREX) 1000 MG tablet Take 1,000 mg by mouth daily.  . hydrocortisone 2.5 % ointment Apply topically. (Patient not taking: Reported on  11/23/2019)   No facility-administered encounter medications on file as of 11/23/2019.    Follow-up: Return in about 1 year (around 11/22/2020), or if symptoms worsen or fail to improve.  Given information on health maintenance disease prevention.  Also given information on alopecia.  He will be following up with me dermatologist soon.  Mliss Sax, MD

## 2019-11-24 LAB — URINALYSIS, ROUTINE W REFLEX MICROSCOPIC
Bilirubin Urine: NEGATIVE
Glucose, UA: NEGATIVE
Hgb urine dipstick: NEGATIVE
Leukocytes,Ua: NEGATIVE
Nitrite: NEGATIVE
Protein, ur: NEGATIVE
Specific Gravity, Urine: 1.029 (ref 1.001–1.03)
pH: 6 (ref 5.0–8.0)

## 2019-11-24 LAB — CBC
HCT: 45.1 % (ref 38.5–50.0)
Hemoglobin: 15.4 g/dL (ref 13.2–17.1)
MCH: 30.3 pg (ref 27.0–33.0)
MCHC: 34.1 g/dL (ref 32.0–36.0)
MCV: 88.8 fL (ref 80.0–100.0)
MPV: 10.4 fL (ref 7.5–12.5)
Platelets: 253 10*3/uL (ref 140–400)
RBC: 5.08 10*6/uL (ref 4.20–5.80)
RDW: 12.2 % (ref 11.0–15.0)
WBC: 6.8 10*3/uL (ref 3.8–10.8)

## 2019-11-24 LAB — COMPREHENSIVE METABOLIC PANEL
AG Ratio: 1.9 (calc) (ref 1.0–2.5)
ALT: 27 U/L (ref 9–46)
AST: 28 U/L (ref 10–40)
Albumin: 4.5 g/dL (ref 3.6–5.1)
Alkaline phosphatase (APISO): 78 U/L (ref 36–130)
BUN: 12 mg/dL (ref 7–25)
CO2: 21 mmol/L (ref 20–32)
Calcium: 9.5 mg/dL (ref 8.6–10.3)
Chloride: 105 mmol/L (ref 98–110)
Creat: 0.87 mg/dL (ref 0.60–1.35)
Globulin: 2.4 g/dL (calc) (ref 1.9–3.7)
Glucose, Bld: 84 mg/dL (ref 65–99)
Potassium: 4.1 mmol/L (ref 3.5–5.3)
Sodium: 140 mmol/L (ref 135–146)
Total Bilirubin: 0.9 mg/dL (ref 0.2–1.2)
Total Protein: 6.9 g/dL (ref 6.1–8.1)

## 2019-11-24 LAB — LIPID PANEL
Cholesterol: 125 mg/dL (ref <200)
HDL: 39 mg/dL — ABNORMAL LOW (ref >=40)
LDL Cholesterol (Calc): 73 mg/dL (calc)
Non-HDL Cholesterol (Calc): 86 mg/dL (calc) (ref <130)
Total CHOL/HDL Ratio: 3.2 (calc) (ref <5.0)
Triglycerides: 46 mg/dL (ref <150)

## 2019-11-24 LAB — HIV ANTIBODY (ROUTINE TESTING W REFLEX): HIV 1&2 Ab, 4th Generation: NONREACTIVE

## 2019-11-26 ENCOUNTER — Encounter: Payer: Self-pay | Admitting: Family Medicine

## 2019-12-06 DIAGNOSIS — L638 Other alopecia areata: Secondary | ICD-10-CM | POA: Diagnosis not present

## 2020-01-07 DIAGNOSIS — L638 Other alopecia areata: Secondary | ICD-10-CM | POA: Diagnosis not present

## 2020-03-31 DIAGNOSIS — L638 Other alopecia areata: Secondary | ICD-10-CM | POA: Diagnosis not present

## 2020-04-17 ENCOUNTER — Ambulatory Visit: Payer: BC Managed Care – PPO | Admitting: Family Medicine

## 2020-05-19 ENCOUNTER — Other Ambulatory Visit: Payer: Self-pay | Admitting: Family Medicine

## 2020-05-19 DIAGNOSIS — A6001 Herpesviral infection of penis: Secondary | ICD-10-CM

## 2020-06-23 ENCOUNTER — Ambulatory Visit (INDEPENDENT_AMBULATORY_CARE_PROVIDER_SITE_OTHER): Payer: BC Managed Care – PPO

## 2020-06-23 ENCOUNTER — Encounter: Payer: Self-pay | Admitting: Family Medicine

## 2020-06-23 ENCOUNTER — Ambulatory Visit (INDEPENDENT_AMBULATORY_CARE_PROVIDER_SITE_OTHER): Payer: BC Managed Care – PPO | Admitting: Family Medicine

## 2020-06-23 ENCOUNTER — Other Ambulatory Visit: Payer: Self-pay

## 2020-06-23 VITALS — BP 120/68 | HR 67 | Temp 98.7°F | Ht 76.0 in | Wt 234.6 lb

## 2020-06-23 DIAGNOSIS — M545 Low back pain, unspecified: Secondary | ICD-10-CM

## 2020-06-23 DIAGNOSIS — M5441 Lumbago with sciatica, right side: Secondary | ICD-10-CM | POA: Insufficient documentation

## 2020-06-23 MED ORDER — KETOROLAC TROMETHAMINE 60 MG/2ML IM SOLN
60.0000 mg | Freq: Once | INTRAMUSCULAR | Status: AC
  Administered 2020-06-23: 60 mg via INTRAMUSCULAR

## 2020-06-23 MED ORDER — MELOXICAM 7.5 MG PO TABS: 7.5000 mg | ORAL_TABLET | Freq: Every day | ORAL | 0 refills | Status: DC

## 2020-06-23 MED ORDER — METHOCARBAMOL 500 MG PO TABS: 500.0000 mg | ORAL_TABLET | Freq: Three times a day (TID) | ORAL | 1 refills | Status: DC | PRN

## 2020-06-23 NOTE — Progress Notes (Signed)
Established Patient Office Visit  Subjective:  Patient ID: Ronald Mahoney, male    DOB: 1985-04-13  Age: 35 y.o. MRN: [id number]  CC:  Chief Complaint  Patient presents with  . Back Pain    Lower back pains x 1 month.     HPI Vic Esco presents for evaluation treatment of lower back pain that is been present over the last month or so.  He cannot think of a specific injury.  The pain is across his lower back.  It sometimes radiates around to the front.  It is not radiating into his legs at this time.  Denies paresthesias or weakness.  There has been no change in his bowel or bladder function.  He has had multiple episodes of this and been seen in the emergency room more than once.  CT scan obtained did shows small kidney stones.  At this time he denies radiation into the groin area or hematuria.  He works in [employer] and does not heavy lifting at work he tells me.  His mother has had problems with her back and needed surgeries.  CT scans of abdomen and pelvis showed no acute musculoskeletal issues.  No past medical history on file.  No past surgical history on file.  Family History  Problem Relation Age of Onset  . Healthy Mother   . Healthy Father     Social History   Socioeconomic History  . Marital status: Single    Spouse name: Not on file  . Number of children: Not on file  . Years of education: Not on file  . Highest education level: Not on file  Occupational History  . Not on file  Tobacco Use  . Smoking status: Former Games developer  . Smokeless tobacco: Never Used  Vaping Use  . Vaping Use: Never used  Substance and Sexual Activity  . Alcohol use: Yes    Comment: 4-6 beers twice weekly  . Drug use: Yes    Types: Marijuana  . Sexual activity: Yes  Other Topics Concern  . Not on file  Social History Narrative  . Not on file   Social Determinants of Health   Financial Resource Strain: Not on file  Food Insecurity: Not on file  Transportation Needs:  Not on file  Physical Activity: Not on file  Stress: Not on file  Social Connections: Not on file  Intimate Partner Violence: Not on file    Outpatient Medications Prior to Visit  Medication Sig Dispense Refill  . valACYclovir (VALTREX) 1000 MG tablet TAKE 0.5 TABLETS (500 MG TOTAL) BY MOUTH 2 (TWO) TIMES DAILY FOR 3 DAYS. AS NEEDED FOR OUTBREAKS. 21 tablet 1  . hydrocortisone 2.5 % ointment Apply topically. (Patient not taking: No sig reported)     No facility-administered medications prior to visit.    No Known Allergies  ROS Review of Systems  Constitutional: Negative.   HENT: Negative.   Respiratory: Negative.   Cardiovascular: Negative.   Gastrointestinal: Negative.  Negative for constipation and diarrhea.  Endocrine: Negative for polyphagia and polyuria.  Genitourinary: Negative for difficulty urinating, hematuria and urgency.  Musculoskeletal: Positive for back pain and myalgias.  Skin: Negative.   Neurological: Negative for weakness and numbness.  Psychiatric/Behavioral: Negative.       Objective:    Physical Exam Vitals and nursing note reviewed.  Constitutional:      General: He is not in acute distress.    Appearance: Normal appearance. He is not ill-appearing, toxic-appearing or  diaphoretic.  Eyes:     General:        Right eye: No discharge.        Left eye: No discharge.     Conjunctiva/sclera: Conjunctivae normal.  Pulmonary:     Effort: Pulmonary effort is normal.  Musculoskeletal:     Lumbar back: No swelling, spasms, tenderness or bony tenderness. Decreased range of motion. Negative right straight leg raise test and negative left straight leg raise test.       Back:  Neurological:     Mental Status: He is alert.     Motor: No weakness.     Deep Tendon Reflexes:     Reflex Scores:      Patellar reflexes are 1+ on the right side and 1+ on the left side.      Achilles reflexes are 1+ on the right side and 1+ on the left side.    Comments:  Negative dural tension signs.  Psychiatric:        Mood and Affect: Mood normal.        Behavior: Behavior normal.     BP 120/68   Pulse 67   Temp 98.7 F (37.1 C) (Temporal)   Ht 6\' 4"  (1.93 m)   Wt 234 lb 9.6 oz (106.4 kg)   SpO2 97%   BMI 28.56 kg/m  Wt Readings from Last 3 Encounters:  06/23/20 234 lb 9.6 oz (106.4 kg)  11/23/19 235 lb (106.6 kg)  09/06/19 249 lb (112.9 kg)     Health Maintenance Due  Topic Date Due  . Hepatitis C Screening  Never done    There are no preventive care reminders to display for this patient.  No results found for: TSH Lab Results  Component Value Date   WBC 6.8 11/23/2019   HGB 15.4 11/23/2019   HCT 45.1 11/23/2019   MCV 88.8 11/23/2019   PLT 253 11/23/2019   Lab Results  Component Value Date   NA 140 11/23/2019   K 4.1 11/23/2019   CO2 21 11/23/2019   GLUCOSE 84 11/23/2019   BUN 12 11/23/2019   CREATININE 0.87 11/23/2019   BILITOT 0.9 11/23/2019   ALKPHOS 74 09/06/2019   AST 28 11/23/2019   ALT 27 11/23/2019   PROT 6.9 11/23/2019   ALBUMIN 4.1 09/06/2019   CALCIUM 9.5 11/23/2019   ANIONGAP 8 09/06/2019   Lab Results  Component Value Date   CHOL 125 11/23/2019   Lab Results  Component Value Date   HDL 39 (L) 11/23/2019   Lab Results  Component Value Date   LDLCALC 73 11/23/2019   Lab Results  Component Value Date   TRIG 46 11/23/2019   Lab Results  Component Value Date   CHOLHDL 3.2 11/23/2019   No results found for: HGBA1C    Assessment & Plan:   Problem List Items Addressed This Visit      Other   Bilateral low back pain without sciatica - Primary   Relevant Medications   methocarbamol (ROBAXIN) 500 MG tablet   meloxicam (MOBIC) 7.5 MG tablet   Other Relevant Orders   Urinalysis, Routine w reflex microscopic   DG Lumbar Spine Complete   Ambulatory referral to Sports Medicine      Meds ordered this encounter  Medications  . ketorolac (TORADOL) injection 60 mg  . methocarbamol  (ROBAXIN) 500 MG tablet    Sig: Take 1 tablet (500 mg total) by mouth every 8 (eight) hours as needed for muscle spasms.  Dispense:  30 tablet    Refill:  1  . meloxicam (MOBIC) 7.5 MG tablet    Sig: Take 1 tablet (7.5 mg total) by mouth daily. For 10 days and then as needed.    Dispense:  30 tablet    Refill:  0    Follow-up: No follow-ups on file.  Patient was given information on managing back pain along with exercises to try.  Try 10 days of Mobic with Robaxin as needed.  Appears to be a chronic problem for him.  I demonstrated proper lifting technique for him.  Believe that he needs sports medicine follow-up.  Mliss Sax, MD

## 2020-06-23 NOTE — Patient Instructions (Addendum)
Back Exercises These exercises help to make your trunk and back strong. They also help to keep the lower back flexible. Doing these exercises can help to prevent back pain or lessen existing pain.  If you have back pain, try to do these exercises 2-3 times each day or as told by your doctor.  As you get better, do the exercises once each day. Repeat the exercises more often as told by your doctor.  To stop back pain from coming back, do the exercises once each day, or as told by your doctor. Exercises Single knee to chest Do these steps 3-5 times in a row for each leg: 1. Lie on your back on a firm bed or the floor with your legs stretched out. 2. Bring one knee to your chest. 3. Grab your knee or thigh with both hands and hold them it in place. 4. Pull on your knee until you feel a gentle stretch in your lower back or buttocks. 5. Keep doing the stretch for 10-30 seconds. 6. Slowly let go of your leg and straighten it. Pelvic tilt Do these steps 5-10 times in a row: 1. Lie on your back on a firm bed or the floor with your legs stretched out. 2. Bend your knees so they point up to the ceiling. Your feet should be flat on the floor. 3. Tighten your lower belly (abdomen) muscles to press your lower back against the floor. This will make your tailbone point up to the ceiling instead of pointing down to your feet or the floor. 4. Stay in this position for 5-10 seconds while you gently tighten your muscles and breathe evenly. Cat-cow Do these steps until your lower back bends more easily: 1. Get on your hands and knees on a firm surface. Keep your hands under your shoulders, and keep your knees under your hips. You may put padding under your knees. 2. Let your head hang down toward your chest. Tighten (contract) the muscles in your belly. Point your tailbone toward the floor so your lower back becomes rounded like the back of a cat. 3. Stay in this position for 5 seconds. 4. Slowly lift your  head. Let the muscles of your belly relax. Point your tailbone up toward the ceiling so your back forms a sagging arch like the back of a cow. 5. Stay in this position for 5 seconds.   Press-ups Do these steps 5-10 times in a row: 1. Lie on your belly (face-down) on the floor. 2. Place your hands near your head, about shoulder-width apart. 3. While you keep your back relaxed and keep your hips on the floor, slowly straighten your arms to raise the top half of your body and lift your shoulders. Do not use your back muscles. You may change where you place your hands in order to make yourself more comfortable. 4. Stay in this position for 5 seconds. 5. Slowly return to lying flat on the floor.   Bridges Do these steps 10 times in a row: 1. Lie on your back on a firm surface. 2. Bend your knees so they point up to the ceiling. Your feet should be flat on the floor. Your arms should be flat at your sides, next to your body. 3. Tighten your butt muscles and lift your butt off the floor until your waist is almost as high as your knees. If you do not feel the muscles working in your butt and the back of your thighs, slide your feet   1-2 inches farther away from your butt. 4. Stay in this position for 3-5 seconds. 5. Slowly lower your butt to the floor, and let your butt muscles relax. If this exercise is too easy, try doing it with your arms crossed over your chest.   Belly crunches Do these steps 5-10 times in a row: 1. Lie on your back on a firm bed or the floor with your legs stretched out. 2. Bend your knees so they point up to the ceiling. Your feet should be flat on the floor. 3. Cross your arms over your chest. 4. Tip your chin a little bit toward your chest but do not bend your neck. 5. Tighten your belly muscles and slowly raise your chest just enough to lift your shoulder blades a tiny bit off of the floor. Avoid raising your body higher than that, because it can put too much stress on your  low back. 6. Slowly lower your chest and your head to the floor. Back lifts Do these steps 5-10 times in a row: 1. Lie on your belly (face-down) with your arms at your sides, and rest your forehead on the floor. 2. Tighten the muscles in your legs and your butt. 3. Slowly lift your chest off of the floor while you keep your hips on the floor. Keep the back of your head in line with the curve in your back. Look at the floor while you do this. 4. Stay in this position for 3-5 seconds. 5. Slowly lower your chest and your face to the floor. Contact a doctor if:  Your back pain gets a lot worse when you do an exercise.  Your back pain does not get better 2 hours after you exercise. If you have any of these problems, stop doing the exercises. Do not do them again unless your doctor says it is okay. Get help right away if:  You have sudden, very bad back pain. If this happens, stop doing the exercises. Do not do them again unless your doctor says it is okay. This information is not intended to replace advice given to you by your health care provider. Make sure you discuss any questions you have with your health care provider. Document Revised: 11/17/2017 Document Reviewed: 11/17/2017 Elsevier Patient Education  2021 Elsevier Inc.  https://doi.org/10.23970/AHRQEPCCER227">  Managing Chronic Back Pain Chronic back pain is back pain that lasts for 12 weeks or longer. It often affects the lower back. Back pain may feel like a muscle ache or a sharp, stabbing pain. It can be mild, moderate, or severe. If you have been diagnosed with chronic back pain, there are things you can do to manage your symptoms. You may have to try different things to see what works best for you. Your health care provider may also give you specific instructions. How to manage lifestyle changes Treating chronic back pain often starts with rest and pain relief, followed by exercises to restore movement and strength to your back  (physical therapy). You may need surgery if other treatments do not help, or if your pain is caused by a condition or an injury. Follow your treatment plan as told by your health care provider. This may include:  Relaxation techniques.  Talk therapy or counseling with a mental health specialist. A form of talk therapy called cognitive behavioral therapy (CBT) can be especially helpful. This therapy helps you set goals and follow up on the changes that you make.  Acupuncture or massage therapy.  Local electrical stimulation.  Injections. These deliver numbing or pain-relieving medicines into your spine or the area of pain. How to recognize changes in your chronic back pain Your condition may improve with treatment. However, back pain may not go away or may get worse over time. Watch your symptoms carefully and let your health care provider know if your symptoms get worse or do not improve. Your back pain may be getting worse if you have:  Pain that begins to cause problems with posture.  Pain that gets worse when you are sitting, standing, walking, bending, or lifting.  Pain that affects you while you are active, or at rest, or both.  Pain that eventually makes it hard to move around (limits mobility).  Pain that occurs with fever, weight loss, or difficulty urinating.  Pain that causes numbness and tingling. How to use body mechanics and posture to help with pain Healthy body mechanics and good posture can help to relieve stress on your back. Body mechanics refers to the movements and positions of your body during your daily activities. Posture is part of body mechanics. Good posture means:  Your spine is in its natural S-curve, or neutral, position.  Your shoulders are pulled back slightly.  Your head is not tipped forward. Follow these guidelines to improve your posture and body mechanics in your everyday activities. Standing  When standing, keep your spine neutral and your  feet about hip-width apart. Keep your knees slightly bent. Your ears, shoulders, and hips should line up.  When you do a task in which you stand in one place for a long time, place one foot on a stable object that is 2-4 inches (5-10 cm) high, such as a footstool. This helps keep your spine neutral.   Sitting  When sitting, keep your spine neutral and your feet flat on the floor. Use a footrest, if necessary, and keep your thighs parallel to the floor. Avoid rounding your shoulders, and avoid tilting your head forward.  When working at a desk or a computer, keep your desk at a height where your hands are slightly lower than your elbows. Slide your chair under your desk so you are close enough to maintain good posture.  When working at a computer, place your monitor at a height where you are looking straight ahead and you do not have to tilt your head forward or downward to view the screen.   Lifting  Keep your feet at least shoulder-width apart and tighten the muscles of your abdomen.  Bend your knees and hips and keep your spine neutral. Be sure to lift using the strength of your legs, not your back. Do not lock your knees straight out.  Always ask for help to lift heavy or awkward objects.   Resting  When lying down and resting, avoid positions that are most painful.  If you have pain with activities such as sitting, bending, stooping, or squatting, lie in a position in which your body does not bend very much. For example, avoid curling up on your side with your arms and knees near your chest (fetal position).  If you have pain with activities such as standing for a long time or reaching with your arms, lie with your spine in a neutral position and bend your knees slightly. Try: ? Lying on your side with a pillow between your knees. ? Lying on your back with a pillow under your knees.   Follow these instructions at home: Medicines  Treatment may include over-the-counter or prescription  medicines for pain and inflammation that are taken by mouth or applied to the skin. Another treatment may include muscle relaxants. Take over-the-counter and prescription medicines only as told by your health care provider.  Ask your health care provider if the medicine prescribed to you: ? Requires you to avoid driving or using machinery. ? Can cause constipation. You may need to take these actions to prevent or treat constipation:  Drink enough fluid to keep your urine pale yellow.  Take over-the-counter or prescription medicines.  Eat foods that are high in fiber, such as beans, whole grains, and fresh fruits and vegetables.  Limit foods that are high in fat and processed sugars, such as fried or sweet foods. Lifestyle  Do not use any products that contain nicotine or tobacco, such as cigarettes, e-cigarettes, and chewing tobacco. If you need help quitting, ask your health care provider.  Eat a healthy diet that includes foods such as vegetables, fruits, fish, and lean meats.  Work with your health care provider to achieve or maintain a healthy weight. General instructions  Get regular exercise as told. Exercise improves flexibility and strength.  If physical therapy was prescribed, do exercises as told by your health care provider.  Use ice or heat therapy as told by your health care provider.  Keep all follow-up visits as told by your health care provider. This is important. Where can I get support? Consider joining a support group for people managing chronic back pain. Ask your health care provider about support groups in your area. You can also find online and in-person support groups through:  The American Chronic Pain Association: theacpa.org  Pain Connection Program: painconnection.org Contact a health care provider if:  You have pain that is not relieved with rest or medicine.  Your pain gets worse, or you have new pain.  You have a fever.  You have rapid weight  loss.  You have trouble doing your normal activities. Get help right away if:  You have weakness or numbness in one or both of your legs or feet.  You have trouble controlling your bladder or your bowels.  You have severe back pain and have any of the following: ? Nausea or vomiting. ? Abdominal pain. ? Shortness of breath or you faint. Summary  Chronic back pain is often treated with rest, pain relief, and physical therapy.  Talk therapy, acupuncture, massage, and local electrical stimulation may help.  Follow your treatment plan as told by your health care provider.  Joining a support group may help you manage chronic back pain. This information is not intended to replace advice given to you by your health care provider. Make sure you discuss any questions you have with your health care provider. Document Revised: 04/05/2019 Document Reviewed: 12/12/2018 Elsevier Patient Education  2021 [employer].

## 2020-06-24 LAB — URINALYSIS, ROUTINE W REFLEX MICROSCOPIC
Bilirubin Urine: NEGATIVE
Hgb urine dipstick: NEGATIVE
Ketones, ur: NEGATIVE
Leukocytes,Ua: NEGATIVE
Nitrite: NEGATIVE
RBC / HPF: NONE SEEN (ref 0–?)
Specific Gravity, Urine: 1.015 (ref 1.000–1.030)
Total Protein, Urine: NEGATIVE
Urine Glucose: NEGATIVE
Urobilinogen, UA: 0.2 (ref 0.0–1.0)
WBC, UA: NONE SEEN (ref 0–?)
pH: 8 (ref 5.0–8.0)

## 2020-07-15 DIAGNOSIS — L638 Other alopecia areata: Secondary | ICD-10-CM | POA: Diagnosis not present

## 2020-08-01 NOTE — Progress Notes (Signed)
[facility] [address] [place] [address] Phone: [phone] Subjective:   Ronald Mahoney, am serving as a scribe for Dr. Antoine Primas. This visit occurred during the SARS-CoV-2 public health emergency.  Safety protocols were in place, including screening questions prior to the visit, additional usage of staff PPE, and extensive cleaning of exam room while observing appropriate contact time as indicated for disinfecting solutions.   I'm seeing this patient by the request  of:  Mliss Sax, MD  CC: Low back and leg pain  GBT:[id number]  Ronald Mahoney is a 35 y.o. male coming in with complaint of low back pain and R leg pain for a few months. Pain radiates into anterior hip and down the R leg to his knee. Unable to move leg out of vehicle without assistance after traveling for long distances.  Pain with flexion. Patient has been using IBU, Aleve, Goodie Powders and Robaxin.     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 No past surgical history on file. Social History   Socioeconomic History  . Marital status: Single    Spouse name: Not on file  . Number of children: Not on file  . Years of education: Not on file  . Highest education level: Not on file  Occupational History  . Not on file  Tobacco Use  . Smoking status: Former Games developer  . Smokeless tobacco: Never Used  Vaping Use  . Vaping Use: Never used  Substance and Sexual Activity  . Alcohol use: Yes    Comment: 4-6 beers twice weekly  . Drug use: Yes    Types: Marijuana  . Sexual activity: Yes  Other Topics Concern  . Not on file  Social History Narrative  . Not on file   Social Determinants of Health   Financial Resource Strain: Not on file  Food Insecurity: Not on file  Transportation Needs: Not on file  Physical Activity: Not on file  Stress: Not on file  Social  Connections: Not on file   No Known Allergies Family History  Problem Relation Age of Onset  . Healthy Mother   . Healthy Father     Current Outpatient Medications (Endocrine & Metabolic):  .  predniSONE (DELTASONE) 20 MG tablet, Take 2 tablets (40 mg total) by mouth daily with breakfast.    Current Outpatient Medications (Analgesics):  .  meloxicam (MOBIC) 7.5 MG tablet, Take 1 tablet (7.5 mg total) by mouth daily. For 10 days and then as needed.   Current Outpatient Medications (Other):  .  hydrocortisone 2.5 % ointment, Apply topically. .  methocarbamol (ROBAXIN) 500 MG tablet, Take 1 tablet (500 mg total) by mouth every 8 (eight) hours as needed for muscle spasms. .  valACYclovir (VALTREX) 1000 MG tablet, TAKE 0.5 TABLETS (500 MG TOTAL) BY MOUTH 2 (TWO) TIMES DAILY FOR 3 DAYS. AS NEEDED FOR OUTBREAKS.   Reviewed prior external information including notes and imaging from  primary care provider As well as notes that were available from care everywhere and other healthcare systems.  Past medical history, social, surgical and family history all reviewed in electronic medical record.  No pertanent information unless stated regarding to the chief complaint.   Review of Systems:  No headache, visual changes, nausea, vomiting, diarrhea, constipation, dizziness, abdominal pain, skin rash, fevers, chills, night sweats, weight loss, swollen lymph nodes, body aches, joint swelling, chest pain,  shortness of breath, mood changes. POSITIVE muscle aches  Objective  Blood pressure 120/78, pulse 76, height 6\' 4"  (1.93 m), weight 234 lb (106.1 kg), SpO2 96 %.   General: No apparent distress alert and oriented x3 mood and affect normal, dressed appropriately.  HEENT: Pupils equal, extraocular movements intact  Respiratory: Patient's speak in full sentences and does not appear short of breath  Cardiovascular: No lower extremity edema, non tender, no erythema  Gait normal with good balance and  coordination.  MSK:  Non tender with full range of motion and good stability and symmetric strength and tone of shoulders, elbows, wrist, hip, knee and ankles bilaterally.  Low back exam does have some loss of lordosis.  Patient only has 10 degrees of flexion and 10 degrees of extension of the back without significant discomfort of the paraspinal musculature of the lumbar spine.  No masses appreciated.  Patient does have a mild increase in tightness noted on the right leg with mild radicular symptoms noted.  Seems to go to the knee but not past his knee.  No significant weakness but increasing discomfort with any type of flexion of the hip against resistance.  Possibly mild weakness on the right compared to the left.  Pain is out of proportion to the amount of palpation noted today in the lumbar spine    Impression and Recommendations:     The above documentation has been reviewed and is accurate and complete , DO

## 2020-08-05 ENCOUNTER — Ambulatory Visit (INDEPENDENT_AMBULATORY_CARE_PROVIDER_SITE_OTHER): Payer: BC Managed Care – PPO | Admitting: Family Medicine

## 2020-08-05 ENCOUNTER — Other Ambulatory Visit: Payer: Self-pay

## 2020-08-05 ENCOUNTER — Encounter: Payer: Self-pay | Admitting: Family Medicine

## 2020-08-05 VITALS — BP 120/78 | HR 76 | Ht 76.0 in | Wt 234.0 lb

## 2020-08-05 DIAGNOSIS — M5416 Radiculopathy, lumbar region: Secondary | ICD-10-CM | POA: Diagnosis not present

## 2020-08-05 DIAGNOSIS — M545 Low back pain, unspecified: Secondary | ICD-10-CM

## 2020-08-05 DIAGNOSIS — G8929 Other chronic pain: Secondary | ICD-10-CM | POA: Diagnosis not present

## 2020-08-05 MED ORDER — PREDNISONE 20 MG PO TABS: 40.0000 mg | ORAL_TABLET | Freq: Every day | ORAL | 0 refills | Status: DC

## 2020-08-05 NOTE — Patient Instructions (Signed)
Good to see you Hip flexor stretches Stop all antiinflammatories Prednisone 40mg  for 5 days MRI back at [facility] We will talk about next steps once I get MRI results

## 2020-08-05 NOTE — Assessment & Plan Note (Signed)
Patient is having worsening pain with now radiation down the leg.  Patient has only 15 degrees of flexion 10 degrees of extension.  Affecting daily activities as well as his job somewhat.  Started on prednisone to see if this would make some improvement.  Discussed with patient about posture and ergonomics.  Depending on findings of the MRI we will discuss the possibility of formal physical therapy or osteopathic manipulation for the conservative route or the possibility of epidurals depending on what we find.  Patient is in agreement with the plan and will follow up with me again after imaging to discuss further treatment options.

## 2020-08-07 NOTE — Addendum Note (Signed)
Addended by: Evon Slack on: 08/07/2020 01:47 PM   Modules accepted: Orders

## 2020-08-21 ENCOUNTER — Other Ambulatory Visit: Payer: Self-pay

## 2020-08-21 ENCOUNTER — Ambulatory Visit
Admission: RE | Admit: 2020-08-21 | Discharge: 2020-08-21 | Disposition: A | Discharge status: Home / Self Care | Payer: BC Managed Care – PPO | Source: Ambulatory Visit | Attending: Family Medicine | Admitting: Family Medicine

## 2020-08-21 DIAGNOSIS — M545 Low back pain, unspecified: Secondary | ICD-10-CM

## 2020-08-21 DIAGNOSIS — M48061 Spinal stenosis, lumbar region without neurogenic claudication: Secondary | ICD-10-CM | POA: Diagnosis not present

## 2020-08-24 ENCOUNTER — Encounter: Payer: Self-pay | Admitting: Family Medicine

## 2020-08-25 ENCOUNTER — Other Ambulatory Visit: Payer: Self-pay

## 2020-08-25 DIAGNOSIS — M5416 Radiculopathy, lumbar region: Secondary | ICD-10-CM

## 2020-08-29 ENCOUNTER — Ambulatory Visit
Admission: RE | Admit: 2020-08-29 | Discharge: 2020-08-29 | Disposition: A | Discharge status: Home / Self Care | Payer: BC Managed Care – PPO | Source: Ambulatory Visit | Attending: Family Medicine | Admitting: Family Medicine

## 2020-08-29 DIAGNOSIS — M5116 Intervertebral disc disorders with radiculopathy, lumbar region: Secondary | ICD-10-CM | POA: Diagnosis not present

## 2020-08-29 DIAGNOSIS — M5416 Radiculopathy, lumbar region: Secondary | ICD-10-CM

## 2020-08-29 MED ORDER — METHYLPREDNISOLONE ACETATE 40 MG/ML INJ SUSP (RADIOLOG
80.0000 mg | Freq: Once | INTRAMUSCULAR | Status: AC
  Administered 2020-08-29: 80 mg via EPIDURAL

## 2020-08-29 MED ORDER — IOPAMIDOL (ISOVUE-M 200) INJECTION 41%
1.0000 mL | Freq: Once | INTRAMUSCULAR | Status: AC
  Administered 2020-08-29: 1 mL via EPIDURAL

## 2020-08-29 NOTE — Discharge Instructions (Signed)

## 2020-09-15 ENCOUNTER — Other Ambulatory Visit: Payer: Self-pay | Admitting: Family Medicine

## 2020-09-15 DIAGNOSIS — A6001 Herpesviral infection of penis: Secondary | ICD-10-CM

## 2020-09-19 NOTE — Progress Notes (Deleted)
[facility] [address] [place] [address] Phone: [phone] Subjective:    I'm seeing this patient by the request  of:  Mliss Sax, MD  CC:   [id number]  08/05/2020 Patient is having worsening pain with now radiation down the leg.  Patient has only 15 degrees of flexion 10 degrees of extension.  Affecting daily activities as well as his job somewhat.  Started on prednisone to see if this would make some improvement.  Discussed with patient about posture and ergonomics.  Depending on findings of the MRI we will discuss the possibility of formal physical therapy or osteopathic manipulation for the conservative route or the possibility of epidurals depending on what we find.  Patient is in agreement with the plan and will follow up with me again after imaging to discuss further treatment options.  Update 09/26/2020 Ronald Mahoney is a 35 y.o. male coming in with complaint of back pain. Epidural on 08/29/2020.   MRI lumbar 08/21/2020 IMPRESSION: 1. Transitional anatomy as above. 2. 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 3. 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     No past medical history on file. No past surgical history on file. Social History   Socioeconomic History   Marital status: Single    Spouse name: Not on file   Number of children: Not on file   Years of education: Not on file   Highest education level: Not on file  Occupational History   Not on file  Tobacco Use   Smoking status: Former   Smokeless tobacco: Never  Vaping Use   Vaping Use: Never used  Substance and Sexual Activity   Alcohol use: Yes    Comment: 4-6 beers twice weekly   Drug use: Yes    Types: Marijuana   Sexual activity: Yes  Other Topics Concern   Not on file  Social History Narrative   Not on file   Social Determinants of Health   Financial  Resource Strain: Not on file  Food Insecurity: Not on file  Transportation Needs: Not on file  Physical Activity: Not on file  Stress: Not on file  Social Connections: Not on file   No Known Allergies Family History  Problem Relation Age of Onset   Healthy Mother    Healthy Father     Current Outpatient Medications (Endocrine & Metabolic):    predniSONE (DELTASONE) 20 MG tablet, Take 2 tablets (40 mg total) by mouth daily with breakfast.    Current Outpatient Medications (Analgesics):    meloxicam (MOBIC) 7.5 MG tablet, Take 1 tablet (7.5 mg total) by mouth daily. For 10 days and then as needed.   Current Outpatient Medications (Other):    hydrocortisone 2.5 % ointment, Apply topically.   methocarbamol (ROBAXIN) 500 MG tablet, Take 1 tablet (500 mg total) by mouth every 8 (eight) hours as needed for muscle spasms.   valACYclovir (VALTREX) 1000 MG tablet, TAKE 0.5 TABLETS (500 MG TOTAL) BY MOUTH 2 (TWO) TIMES DAILY FOR 3 DAYS. AS NEEDED FOR OUTBREAKS.   Reviewed prior external information including notes and imaging from  primary care provider As well as notes that were available from care everywhere and other healthcare systems.  Past medical history, social, surgical and family history all reviewed in electronic medical record.  No pertanent information unless stated regarding to the chief complaint.   Review of Systems:  No headache, visual changes,  nausea, vomiting, diarrhea, constipation, dizziness, abdominal pain, skin rash, fevers, chills, night sweats, weight loss, swollen lymph nodes, body aches, joint swelling, chest pain, shortness of breath, mood changes. POSITIVE muscle aches  Objective  There were no vitals taken for this visit.   General: No apparent distress alert and oriented x3 mood and affect normal, dressed appropriately.  HEENT: Pupils equal, extraocular movements intact  Respiratory: Patient's speak in full sentences and does not appear short of breath   Cardiovascular: No lower extremity edema, non tender, no erythema  Gait normal with good balance and coordination.  MSK:  Non tender with full range of motion and good stability and symmetric strength and tone of shoulders, elbows, wrist, hip, knee and ankles bilaterally.     Impression and Recommendations:     The above documentation has been reviewed and is accurate and complete Wilford Grist

## 2020-09-26 ENCOUNTER — Ambulatory Visit: Payer: BC Managed Care – PPO | Admitting: Family Medicine

## 2020-09-29 ENCOUNTER — Other Ambulatory Visit: Payer: Self-pay | Admitting: Family Medicine

## 2020-09-29 DIAGNOSIS — A6001 Herpesviral infection of penis: Secondary | ICD-10-CM

## 2020-09-29 NOTE — Telephone Encounter (Signed)
Duplicate request, Rx has already been filled 2 days ago

## 2020-10-03 MED ORDER — KETOROLAC TROMETHAMINE 60 MG/2ML IM SOLN
60.0000 mg | Freq: Once | INTRAMUSCULAR | Status: AC
  Administered 2020-06-23: 60 mg via INTRAMUSCULAR

## 2020-10-03 NOTE — Addendum Note (Signed)
Addended by: Lake Bells on: 10/03/2020 02:29 PM   Modules accepted: Orders

## 2020-10-23 ENCOUNTER — Ambulatory Visit (INDEPENDENT_AMBULATORY_CARE_PROVIDER_SITE_OTHER): Payer: BC Managed Care – PPO | Admitting: Family Medicine

## 2020-10-23 ENCOUNTER — Other Ambulatory Visit: Payer: Self-pay

## 2020-10-23 DIAGNOSIS — M9903 Segmental and somatic dysfunction of lumbar region: Secondary | ICD-10-CM

## 2020-10-23 DIAGNOSIS — M545 Low back pain, unspecified: Secondary | ICD-10-CM

## 2020-10-23 DIAGNOSIS — M9902 Segmental and somatic dysfunction of thoracic region: Secondary | ICD-10-CM

## 2020-10-23 DIAGNOSIS — M9904 Segmental and somatic dysfunction of sacral region: Secondary | ICD-10-CM

## 2020-10-23 MED ORDER — GABAPENTIN 100 MG PO CAPS: ORAL_CAPSULE | ORAL | 1 refills | Status: DC

## 2020-10-23 NOTE — Progress Notes (Signed)
[name] [address] [place] [address] Phone: [phone] Subjective:   [facility], am serving as a scribe for Dr. Antoine Primas.  I'm seeing this patient by the request  of:  Mliss Sax, MD  CC: low back pain follow up   [id number]  Ronald Mahoney is a 35 y.o. male coming in with complaint of low back pain. Worsening pain initially, MRI showed L3/4 foraminal stenosis and L5/S1 stenosis epidural given 6/24 at L3/4 patient states that the epidural did not help much at all. The first week or two was okay, states pain went away in abdomen area and lower leg, but low back is still in lots of pain.        No past medical history on file. No past surgical history on file. Social History   Socioeconomic History   Marital status: Single    Spouse name: Not on file   Number of children: Not on file   Years of education: Not on file   Highest education level: Not on file  Occupational History   Not on file  Tobacco Use   Smoking status: Former   Smokeless tobacco: Never  Vaping Use   Vaping Use: Never used  Substance and Sexual Activity   Alcohol use: Yes    Comment: 4-6 beers twice weekly   Drug use: Yes    Types: Marijuana   Sexual activity: Yes  Other Topics Concern   Not on file  Social History Narrative   Not on file   Social Determinants of Health   Financial Resource Strain: Not on file  Food Insecurity: Not on file  Transportation Needs: Not on file  Physical Activity: Not on file  Stress: Not on file  Social Connections: Not on file   No Known Allergies Family History  Problem Relation Age of Onset   Healthy Mother    Healthy Father     Current Outpatient Medications (Endocrine & Metabolic):    predniSONE (DELTASONE) 20 MG tablet, Take 2 tablets (40 mg total) by mouth daily with breakfast.    Current Outpatient Medications (Analgesics):    meloxicam (MOBIC) 7.5 MG tablet, Take 1 tablet  (7.5 mg total) by mouth daily. For 10 days and then as needed.   Current Outpatient Medications (Other):    gabapentin (NEURONTIN) 100 MG capsule, Take two capsules daily at night time.   hydrocortisone 2.5 % ointment, Apply topically.   methocarbamol (ROBAXIN) 500 MG tablet, Take 1 tablet (500 mg total) by mouth every 8 (eight) hours as needed for muscle spasms.   valACYclovir (VALTREX) 1000 MG tablet, TAKE 0.5 TABLETS (500 MG TOTAL) BY MOUTH 2 (TWO) TIMES DAILY FOR 3 DAYS. AS NEEDED FOR OUTBREAKS.   Reviewed prior external information including notes and imaging from  primary care provider As well as notes that were available from care everywhere and other healthcare systems.  Past medical history, social, surgical and family history all reviewed in electronic medical record.  No pertanent information unless stated regarding to the chief complaint.   Review of Systems:  No headache, visual changes, nausea, vomiting, diarrhea, constipation, dizziness, abdominal pain, skin rash, fevers, chills, night sweats, weight loss, swollen lymph nodes, body aches, joint swelling, chest pain, shortness of breath, mood changes. POSITIVE muscle aches  Objective  Blood pressure 120/60, pulse 82, height 6\' 4"  (1.93 m), weight 237 lb (107.5 kg), SpO2 96 %.   General: No apparent distress alert and oriented x3  mood and affect normal, dressed appropriately.  HEENT: Pupils equal, extraocular movements intact  Respiratory: Patient's speak in full sentences and does not appear short of breath  Cardiovascular: No lower extremity edema, non tender, no erythema  Gait normal with good balance and coordination.  MSK:  Non tender with full range of motion and good stability and symmetric strength and tone of shoulders, elbows, wrist, hip, knee and ankles bilaterally.  Level low back exam does show the patient does have some mild loss of lordosis.  Tightness with Pearlean Brownie right greater than left.  Patient does have some  mild extension difficulty lacking 5 degrees.  Tender to palpation in the paraspinal musculature of the lumbar spine.  Osteopathic findings T7 extended rotated and side bent left L1 flexed rotated and side bent right Sacrum right on right   Impression and Recommendations:     The above documentation has been reviewed and is accurate and complete Judi Saa, DO

## 2020-10-23 NOTE — Assessment & Plan Note (Signed)
Patient likely is no longer having the radicular symptoms.  Patient continues to have some back pain.  On MRI does show some facet arthropathy.  Responded fairly well though to osteopathic manipulation.  Discussed with patient having some nighttime pain to start gabapentin at a very low dose.  Warned of potential side effects but should do relatively well.  Discussed icing regimen and home exercises.  Follow-up again in 6 to 8 weeks

## 2020-10-23 NOTE — Assessment & Plan Note (Signed)
Degenerative shoulder  Decision today to treat with OMT was based on Physical Exam  After verbal consent patient was treated with HVLA, ME, FPR techniques in  thoracic,  lumbar and sacral areas, all areas are chronic   Patient tolerated the procedure well with improvement in symptoms  Patient given exercises, stretches and lifestyle modifications  See medications in patient instructions if given  Patient will follow up in 4-8 weeks

## 2020-10-23 NOTE — Patient Instructions (Addendum)
Good to see you  Gabapentin 200mg  daily at night time if makes you too sleepy go down to 100mg  nightly Tried manipulations with success Try to do back exercises 3 times weekly  See me again in 6-7 weeks

## 2020-11-20 MED ORDER — KETOROLAC TROMETHAMINE 60 MG/2ML IM SOLN: 60.0000 mg | Freq: Once | INTRAMUSCULAR | Status: AC

## 2020-11-20 NOTE — Addendum Note (Signed)
Addended by: Andrez Grime on: 11/20/2020 12:52 PM   Modules accepted: Orders

## 2020-12-01 NOTE — Progress Notes (Deleted)
  [name] [address] [place] [address] Phone: [phone] Subjective:    I'm seeing this patient by the request  of:  Mliss Sax, MD  CC: neck pain   [id number]  [facility] is a 35 y.o. male coming in with complaint of back and neck pain. OMT on 10/23/2020. Patient states   Medications patient has been prescribed: Gabapentin  Taking:         Reviewed prior external information including notes and imaging from previsou exam, outside providers and external EMR if available.   As well as notes that were available from care everywhere and other healthcare systems.  Past medical history, social, surgical and family history all reviewed in electronic medical record.  No pertanent information unless stated regarding to the chief complaint.   No past medical history on file.  No Known Allergies   Review of Systems:  No headache, visual changes, nausea, vomiting, diarrhea, constipation, dizziness, abdominal pain, skin rash, fevers, chills, night sweats, weight loss, swollen lymph nodes, body aches, joint swelling, chest pain, shortness of breath, mood changes. POSITIVE muscle aches  Objective  There were no vitals taken for this visit.   General: No apparent distress alert and oriented x3 mood and affect normal, dressed appropriately.  HEENT: Pupils equal, extraocular movements intact  Respiratory: Patient's speak in full sentences and does not appear short of breath  Cardiovascular: No lower extremity edema, non tender, no erythema  Neuro: Cranial nerves II through XII are intact, neurovascularly intact in all extremities with 2+ DTRs and 2+ pulses.  Gait normal with good balance and coordination.  MSK:  Non tender with full range of motion and good stability and symmetric strength and tone of shoulders, elbows, wrist, hip, knee and ankles bilaterally.  Neck exam shows  Low back exam shows   Osteopathic  findings  C2 flexed rotated and side bent right C6 flexed rotated and side bent left T3 extended rotated and side bent right inhaled rib T9 extended rotated and side bent left L2 flexed rotated and side bent right Sacrum right on right       Assessment and Plan:  No problem-specific Assessment & Plan notes found for this encounter.   Nonallopathic problems  Decision today to treat with OMT was based on Physical Exam  After verbal consent patient was treated with HVLA, ME, FPR techniques in cervical, rib, thoracic, lumbar, and sacral  areas  Patient tolerated the procedure well with improvement in symptoms  Patient given exercises, stretches and lifestyle modifications  See medications in patient instructions if given  Patient will follow up in 4-8 weeks      The above documentation has been reviewed and is accurate and complete Ronald Saa, DO        Note: This dictation was prepared with Dragon dictation along with smaller phrase technology. Any transcriptional errors that result from this process are unintentional.

## 2020-12-02 ENCOUNTER — Ambulatory Visit: Payer: BC Managed Care – PPO | Admitting: Family Medicine

## 2020-12-04 NOTE — Progress Notes (Signed)
[facility] [address] [place] [address] Phone: [phone] Subjective:   Ronald [facility]oney, am serving as a scribe for Dr. Antoine Primas. This visit occurred during the SARS-CoV-2 public health emergency.  Safety protocols were in place, including screening questions prior to the visit, additional usage of staff PPE, and extensive cleaning of exam room while observing appropriate contact time as indicated for disinfecting solutions.   I'm seeing this patient by the request  of:  Mliss Sax, MD  CC: Low back pain follow-up  [id number]  Ronald Mahoney is a 35 y.o. male coming in with complaint of back and neck pain. OMT on 10/23/2020. Patient states back pain continues. Got worse on a drive to [place] recently. Characterized as burning.  Patient states that sitting for long time or certain activities cause it to be worse.  Patient did have the MRI showing that there was an L5-S1 nerve impingement.  Initially did respond to the epidural but now having worsening pain again.  Unable to afford having another potential 1.  Medications patient has been prescribed: Gabapentin  Taking:         No past medical history on file.  No Known Allergies   Review of Systems:  No headache, visual changes, nausea, vomiting, diarrhea, constipation, dizziness, abdominal pain, skin rash, fevers, chills, night sweats, weight loss, swollen lymph nodes, body aches, joint swelling, chest pain, shortness of breath, mood changes. POSITIVE muscle aches  Objective  Blood pressure 118/64, pulse 80, height 6\' 4"  (1.93 m), weight 246 lb (111.6 kg), SpO2 96 %.   General: No apparent distress alert and oriented x3 mood and affect normal, dressed appropriately.  HEENT: Pupils equal, extraocular movements intact  Respiratory: Patient's speak in full sentences and does not appear short of breath  Cardiovascular: No lower extremity edema, non tender, no erythema   Low back exam shows the patient does have some loss of lordosis.  Patient does have tenderness to palpation in the paraspinal musculature right greater than left.  The patient does have mild positive straight leg test on the right side.  Osteopathic findings  T5 extended rotated and side bent left L2 flexed rotated and side bent right L5 flexed rotated and side bent left Sacrum right on right       Assessment and Plan: Lumbar radiculopathy, right Patient does have the L5-S1 area on the right side that is contributing noted on the MRI.  Patient did respond initially to the epidural but is having worsening pain again.  Attempted osteopathic manipulation again but hopefully will be beneficial.  Patient given Flexeril low-dose to see how patient responds as well.  Discussed icing regimen and home exercises.  Follow-up again in 4 weeks.  If continuing to have increasing difficulty can consider the possibility of a SI injection or piriformis injection if necessary.    Nonallopathic problems  Decision today to treat with OMT was based on Physical Exam  After verbal consent patient was treated with HVLA, ME, FPR techniques in thoracic, lumbar, and sacral  areas  Patient tolerated the procedure well with improvement in symptoms  Patient given exercises, stretches and lifestyle modifications  See medications in patient instructions if given  Patient will follow up in 4-8 weeks      The above documentation has been reviewed and is accurate and complete , DO        Note: This dictation was prepared with Dragon dictation along with smaller phrase  technology. Any transcriptional errors that result from this process are unintentional.

## 2020-12-09 ENCOUNTER — Encounter: Payer: Self-pay | Admitting: Family Medicine

## 2020-12-09 ENCOUNTER — Other Ambulatory Visit: Payer: Self-pay

## 2020-12-09 ENCOUNTER — Ambulatory Visit (INDEPENDENT_AMBULATORY_CARE_PROVIDER_SITE_OTHER): Payer: BC Managed Care – PPO | Admitting: Family Medicine

## 2020-12-09 VITALS — BP 118/64 | HR 80 | Ht 76.0 in | Wt 246.0 lb

## 2020-12-09 DIAGNOSIS — M5416 Radiculopathy, lumbar region: Secondary | ICD-10-CM | POA: Diagnosis not present

## 2020-12-09 DIAGNOSIS — M9903 Segmental and somatic dysfunction of lumbar region: Secondary | ICD-10-CM

## 2020-12-09 DIAGNOSIS — M9902 Segmental and somatic dysfunction of thoracic region: Secondary | ICD-10-CM | POA: Diagnosis not present

## 2020-12-09 DIAGNOSIS — M9904 Segmental and somatic dysfunction of sacral region: Secondary | ICD-10-CM

## 2020-12-09 MED ORDER — VENLAFAXINE HCL ER 37.5 MG PO CP24: 37.5000 mg | ORAL_CAPSULE | Freq: Every day | ORAL | 0 refills | Status: DC

## 2020-12-09 NOTE — Patient Instructions (Addendum)
Start Effexor tomorrow Up to you on taking Gabapentin See you again in 4-6 weeks

## 2020-12-09 NOTE — Assessment & Plan Note (Signed)
Patient does have the L5-S1 area on the right side that is contributing noted on the MRI.  Patient did respond initially to the epidural but is having worsening pain again.  Attempted osteopathic manipulation again but hopefully will be beneficial.  Patient given Flexeril low-dose to see how patient responds as well.  Discussed icing regimen and home exercises.  Follow-up again in 4 weeks.  If continuing to have increasing difficulty can consider the possibility of a SI injection or piriformis injection if necessary.

## 2020-12-20 ENCOUNTER — Other Ambulatory Visit: Payer: Self-pay | Admitting: Family Medicine

## 2020-12-20 DIAGNOSIS — A6001 Herpesviral infection of penis: Secondary | ICD-10-CM

## 2021-02-02 ENCOUNTER — Telehealth: Payer: Self-pay | Admitting: Family Medicine

## 2021-02-02 NOTE — Telephone Encounter (Signed)
Patient calling states that he still seems to be having bad back pains and would like a referral to someone else. Last OV 06/17/20 will patient need to come in office for evaluation? Please advise.

## 2021-02-03 NOTE — Telephone Encounter (Signed)
Called patient to inform that Dr. Doreene Burke suggest that patient ask Dr. Katrinka Blazing for referral to have second opinion. No answer LM informing patient also asked to give [place] a call with any questions.

## 2021-02-03 NOTE — Telephone Encounter (Signed)
Pt called back and I made him aware of note below

## 2021-06-04 ENCOUNTER — Encounter: Payer: Self-pay | Admitting: Family Medicine

## 2021-06-04 ENCOUNTER — Ambulatory Visit (INDEPENDENT_AMBULATORY_CARE_PROVIDER_SITE_OTHER): Payer: BC Managed Care – PPO | Admitting: Family Medicine

## 2021-06-04 VITALS — BP 124/68 | HR 65 | Temp 97.3°F | Ht 76.0 in | Wt 256.0 lb

## 2021-06-04 DIAGNOSIS — M5441 Lumbago with sciatica, right side: Secondary | ICD-10-CM | POA: Diagnosis not present

## 2021-06-04 DIAGNOSIS — G8929 Other chronic pain: Secondary | ICD-10-CM

## 2021-06-04 MED ORDER — DICLOFENAC SODIUM 75 MG PO TBEC: DELAYED_RELEASE_TABLET | ORAL | 0 refills | Status: DC

## 2021-06-04 MED ORDER — METHOCARBAMOL 750 MG PO TABS: 750.0000 mg | ORAL_TABLET | Freq: Three times a day (TID) | ORAL | 0 refills | Status: DC | PRN

## 2021-06-04 MED ORDER — PREGABALIN 25 MG PO CAPS: ORAL_CAPSULE | ORAL | 0 refills | Status: DC

## 2021-06-04 NOTE — Progress Notes (Signed)
? ?Established Patient Office Visit ? ?Subjective:  ?Patient ID: Ronald Mahoney, male    DOB: 26-Jun-1985  Age: 36 y.o. MRN: [id number] ? ?CC:  ?Chief Complaint  ?Patient presents with  ? Back Pain  ?  Consistent back pains will not let up, pt would like MRI.   ? ? ?HPI ?Ronald Mahoney presents for evaluation for ongoing lower back pain pain is in the lower part of his back.  It it moves up his back and across his lower back.  There is some radiation of pain down the back of his right leg to his knee.  Describes tingling in his medial right foot area.  Denies weakness or change in bowel or bladder functions.  Denies bowel or bladder dysfunction.  There is pain with bending and stooping.  There is pain with prolonged standing.  There is pain when he jumps on the trampoline with his kids.  Prior medications were not particularly helpful.  ? ?No past medical history on file. ? ?No past surgical history on file. ? ?Family History  ?Problem Relation Age of Onset  ? Healthy Mother   ? Healthy Father   ? ? ?Social History  ? ?Socioeconomic History  ? Marital status: Single  ?  Spouse name: Not on file  ? Number of children: Not on file  ? Years of education: Not on file  ? Highest education level: Not on file  ?Occupational History  ? Not on file  ?Tobacco Use  ? Smoking status: Former  ? Smokeless tobacco: Never  ?Vaping Use  ? Vaping Use: Never used  ?Substance and Sexual Activity  ? Alcohol use: Yes  ?  Comment: 4-6 beers twice weekly  ? Drug use: Yes  ?  Types: Marijuana  ? Sexual activity: Yes  ?Other Topics Concern  ? Not on file  ?Social History Narrative  ? Not on file  ? ?Social Determinants of Health  ? ?Financial Resource Strain: Not on file  ?Food Insecurity: Not on file  ?Transportation Needs: Not on file  ?Physical Activity: Not on file  ?Stress: Not on file  ?Social Connections: Not on file  ?Intimate Partner Violence: Not on file  ? ? ?Outpatient Medications Prior to Visit  ?Medication Sig Dispense Refill  ?  venlafaxine XR (EFFEXOR XR) 37.5 MG 24 hr capsule Take 1 capsule (37.5 mg total) by mouth daily with breakfast. (Patient not taking: Reported on 06/04/2021) 30 capsule 0  ? gabapentin (NEURONTIN) 100 MG capsule Take two capsules daily at night time. (Patient not taking: Reported on 06/04/2021) 30 capsule 1  ? hydrocortisone 2.5 % ointment Apply topically. (Patient not taking: Reported on 06/04/2021)    ? meloxicam (MOBIC) 7.5 MG tablet Take 1 tablet (7.5 mg total) by mouth daily. For 10 days and then as needed. (Patient not taking: Reported on 06/04/2021) 30 tablet 0  ? methocarbamol (ROBAXIN) 500 MG tablet Take 1 tablet (500 mg total) by mouth every 8 (eight) hours as needed for muscle spasms. (Patient not taking: Reported on 06/04/2021) 30 tablet 1  ? predniSONE (DELTASONE) 20 MG tablet Take 2 tablets (40 mg total) by mouth daily with breakfast. (Patient not taking: Reported on 06/04/2021) 10 tablet 0  ? valACYclovir (VALTREX) 1000 MG tablet TAKE 0.5 TABLETS (500 MG TOTAL) BY MOUTH 2 (TWO) TIMES DAILY FOR 3 DAYS. AS NEEDED FOR OUTBREAKS. (Patient not taking: Reported on 06/04/2021) 30 tablet 3  ? ?No facility-administered medications prior to visit.  ? ? ?No Known Allergies ? ?  ROS ?Review of Systems  ?Constitutional: Negative.   ?Respiratory: Negative.    ?Cardiovascular: Negative.   ?Gastrointestinal: Negative.   ?Genitourinary: Negative.   ?Musculoskeletal:  Positive for back pain and myalgias.  ?Neurological:  Positive for numbness. Negative for weakness.  ?Psychiatric/Behavioral: Negative.    ? ?  ?Objective:  ?  ?Physical Exam ?Vitals and nursing note reviewed.  ?Constitutional:   ?   Appearance: Normal appearance.  ?Eyes:  ?   General: No scleral icterus.    ?   Right eye: No discharge.     ?   Left eye: No discharge.  ?   Conjunctiva/sclera: Conjunctivae normal.  ?Pulmonary:  ?   Effort: Pulmonary effort is normal.  ?Musculoskeletal:  ?   Lumbar back: No spasms or tenderness. Normal range of motion. Negative right  straight leg raise test and negative left straight leg raise test.  ?   Comments: Flexion to toes with effort.  Pain with extension.  ?Skin: ?   General: Skin is warm and dry.  ?Neurological:  ?   Mental Status: He is alert and oriented to person, place, and time.  ?   Motor: No weakness.  ?   Deep Tendon Reflexes:  ?   Reflex Scores: ?     Patellar reflexes are 1+ on the right side and 1+ on the left side. ?     Achilles reflexes are 1+ on the right side and 1+ on the left side. ?Psychiatric:     ?   Mood and Affect: Mood normal.     ?   Behavior: Behavior normal.  ? ? ?BP 124/68 (BP Location: Left Arm, Patient Position: Sitting, Cuff Size: Large)   Pulse 65   Temp (!) 97.3 ?F (36.3 ?C) (Temporal)   Ht 6\' 4"  (1.93 m)   Wt 256 lb (116.1 kg)   SpO2 98%   BMI 31.16 kg/m?  ?Wt Readings from Last 3 Encounters:  ?06/04/21 256 lb (116.1 kg)  ?12/09/20 246 lb (111.6 kg)  ?10/23/20 237 lb (107.5 kg)  ? ? ? ?Health Maintenance Due  ?Topic Date Due  ? Hepatitis C Screening  Never done  ? ? ?There are no preventive care reminders to display for this patient. ? ?No results found for: TSH ?Lab Results  ?Component Value Date  ? WBC 6.8 11/23/2019  ? HGB 15.4 11/23/2019  ? HCT 45.1 11/23/2019  ? MCV 88.8 11/23/2019  ? PLT 253 11/23/2019  ? ?Lab Results  ?Component Value Date  ? NA 140 11/23/2019  ? K 4.1 11/23/2019  ? CO2 21 11/23/2019  ? GLUCOSE 84 11/23/2019  ? BUN 12 11/23/2019  ? CREATININE 0.87 11/23/2019  ? BILITOT 0.9 11/23/2019  ? ALKPHOS 74 09/06/2019  ? AST 28 11/23/2019  ? ALT 27 11/23/2019  ? PROT 6.9 11/23/2019  ? ALBUMIN 4.1 09/06/2019  ? CALCIUM 9.5 11/23/2019  ? ANIONGAP 8 09/06/2019  ? ?Lab Results  ?Component Value Date  ? CHOL 125 11/23/2019  ? ?Lab Results  ?Component Value Date  ? HDL 39 (L) 11/23/2019  ? ?Lab Results  ?Component Value Date  ? LDLCALC 73 11/23/2019  ? ?Lab Results  ?Component Value Date  ? TRIG 46 11/23/2019  ? ?Lab Results  ?Component Value Date  ? CHOLHDL 3.2 11/23/2019  ? ?No results  found for: HGBA1C ? ?  ?Assessment & Plan:  ? ?Problem List Items Addressed This Visit   ?None ?Visit Diagnoses   ? ? Chronic bilateral low  back pain with right-sided sciatica    -  Primary  ? Relevant Medications  ? diclofenac (VOLTAREN) 75 MG EC tablet  ? methocarbamol (ROBAXIN-750) 750 MG tablet  ? pregabalin (LYRICA) 25 MG capsule  ? Other Relevant Orders  ? Ambulatory referral to Sports Medicine  ? ?  ? ? ?Meds ordered this encounter  ?Medications  ? diclofenac (VOLTAREN) 75 MG EC tablet  ?  Sig: Take 1 twice daily with food for 2 weeks and then may use twice daily as needed.  ?  Dispense:  30 tablet  ?  Refill:  0  ? methocarbamol (ROBAXIN-750) 750 MG tablet  ?  Sig: Take 1 tablet (750 mg total) by mouth every 8 (eight) hours as needed for muscle spasms.  ?  Dispense:  60 tablet  ?  Refill:  0  ? pregabalin (LYRICA) 25 MG capsule  ?  Sig: May take 1-2 prior to bedtime.  ?  Dispense:  60 capsule  ?  Refill:  0  ? ? ?Follow-up: No follow-ups on file.  ? ? ?Libby Maw, MD ?

## 2021-06-19 ENCOUNTER — Encounter: Payer: Self-pay | Admitting: Family Medicine

## 2021-06-22 NOTE — Progress Notes (Deleted)
    Aleen Sells D.[facility] [address] [place] [address] Phone: [phone]   Assessment and Plan:     There are no diagnoses linked to this encounter.  ***   Pertinent previous records reviewed include ***   Follow Up: ***     Subjective:   I, Ronald Mahoney, am serving as a Neurosurgeon for Ronald Mahoney  Chief Complaint: low back pain   HPI:  12/09/2020 Ronald Mahoney is a 36 y.o. male coming in with complaint of back and neck pain. OMT on 10/23/2020. Patient states back pain continues. Got worse on a drive to [place] recently. Characterized as burning.  Patient states that sitting for long time or certain activities cause it to be worse.  Patient did have the MRI showing that there was an L5-S1 nerve impingement.  Initially did respond to the epidural but now having worsening pain again.  Unable to afford having another potential 1.   Medications patient has been prescribed: Gabapentin  06/23/2021 Patient states  Relevant Historical Information: ***  Additional pertinent review of systems negative.   Current Outpatient Medications:    diclofenac (VOLTAREN) 75 MG EC tablet, Take 1 twice daily with food for 2 weeks and then may use twice daily as needed., Disp: 60 tablet, Rfl: 0   methocarbamol (ROBAXIN-750) 750 MG tablet, Take 1 tablet (750 mg total) by mouth every 8 (eight) hours as needed for muscle spasms., Disp: 60 tablet, Rfl: 0   pregabalin (LYRICA) 25 MG capsule, May take 1-2 prior to bedtime., Disp: 60 capsule, Rfl: 0   venlafaxine XR (EFFEXOR XR) 37.5 MG 24 hr capsule, Take 1 capsule (37.5 mg total) by mouth daily with breakfast. (Patient not taking: Reported on 06/04/2021), Disp: 30 capsule, Rfl: 0   Objective:     There were no vitals filed for this visit.    There is no height or weight on file to calculate BMI.    Physical Exam:    ***   Electronically signed by:  Aleen Sells D.[facility]  [facility] 10:44 AM 06/22/21

## 2021-06-23 ENCOUNTER — Ambulatory Visit: Payer: BC Managed Care – PPO | Admitting: Sports Medicine

## 2021-06-24 NOTE — Progress Notes (Addendum)
? ? Ronald Mahoney Ronald Mahoney ?[facility] ?[address] [place] [address] ?Phone: [phone] ?  ?Assessment and Plan:   ?  ?1. Lumbar radiculopathy, right ?2. Spinal stenosis, lumbar region, with neurogenic claudication ?3. Chronic bilateral low back pain with right-sided sciatica ?-Chronic with exacerbation, subsequent sports medicine visit ?- Recurrence of low back pain, worse on right with right-sided radicular symptoms consistent with right neuroforaminal stenosis at L5-S1 from lumbar MRI on 08/21/2020 ?- Discussed lumbar MRI from 08/21/2020 with patient which shows DDD greatest at L3-4, and right foraminal stenosis L5-S1.  As patient's symptoms are most consistent with right foraminal stenosis at L5-S1, we will proceed with lumbar epidural at this level ?- Voltaren 75 mg twice daily as needed for pain relief ?- Continue Robaxin as needed for muscle spasms at night ?- DG INJECT DIAG/THERA/INC NEEDLE/CATH/PLC EPI/LUMB/SAC W/IMG; Future  ? ?*Addendum-07/14/2021*: Patient's request for epidural injection was denied.  I called and spoke with peer to peer who informed me of the specific requirements that insurance is looking for.  I called and spoke with patient.  Patient states that his pain is frequently >6 out of 10 and will get as high as 10 out of 10.  He states that he got 30 to 40% relief from prior epidural injection.  He states that he has been doing an ongoing home exercise program and stretching program without any relief.  We will resubmit epidural request for right-sided L5-S1. ? ? ?Pertinent previous records reviewed include lumbar MRI 08/21/2020, epidural report 08/29/2020 ?  ?Follow Up: 2 weeks after epidural to review effectiveness ?  ?Subjective:   ?I, Ronald Mahoney, am serving as a Neurosurgeon for [facility] [employer] ? ?Chief Complaint: bilateral low back pain  ? ?HPI:  ? ?06/25/2021 ?Patient is a 36 year old male complaining of low back pain. Patient states pain is in  the lower part of his back.  It it moves up his back and across his lower back and wraps around to the front . There is some radiation of pain down the back of his right leg to his knee.  Describes tingling in his medial right foot area.  Denies weakness or change in bowel or bladder functions.  Denies bowel or bladder dysfunction.  There is pain with bending and stooping.  There is pain with prolonged standing.  There is pain when he jumps on the trampoline with his kids felt like his back locked up .  Prior medications were not particularly helpful.  Cant remember which meds he taking, has a hard time functioning after sitting for a while has to stretch it out  ? ?Relevant Historical Information: DDD, lumbar spinal stenosis ? ?Additional pertinent review of systems negative. ? ? ?Current Outpatient Medications:  ?  diclofenac (VOLTAREN) 75 MG EC tablet, Take 1 twice daily with food for 2 weeks and then may use twice daily as needed., Disp: 60 tablet, Rfl: 0 ?  methocarbamol (ROBAXIN-750) 750 MG tablet, Take 1 tablet (750 mg total) by mouth every 8 (eight) hours as needed for muscle spasms., Disp: 60 tablet, Rfl: 0 ?  pregabalin (LYRICA) 25 MG capsule, May take 1-2 prior to bedtime., Disp: 60 capsule, Rfl: 0 ?  venlafaxine XR (EFFEXOR XR) 37.5 MG 24 hr capsule, Take 1 capsule (37.5 mg total) by mouth daily with breakfast., Disp: 30 capsule, Rfl: 0  ? ?Objective:   ?  ?Vitals:  ? 06/25/21 1339  ?BP: 120/80  ?Pulse: 78  ?SpO2:  98%  ?Weight: 262 lb (118.8 kg)  ?Height: 6\' 4"  (1.93 m)  ?  ?  ?Body mass index is 31.89 kg/m?.  ?  ?Physical Exam:   ? ?Gen: Appears well, nad, nontoxic and pleasant ?Psych: Alert and oriented, appropriate mood and affect ?Neuro: sensation intact, strength is 5/5 in upper and lower extremities, muscle tone wnl ?Skin: no susupicious lesions or rashes ? ?Back - Normal skin, Spine with normal alignment and no deformity.   ?No tenderness to vertebral process palpation.   ?Paraspinous muscles are    tender right greater than sign left and without spasm ?Straight leg raise positive right, negative left ?Gait normal ? ? ?Electronically signed by:  ? D.Ronald Mahoney ?[facility] ?2:22 PM 06/25/21 ?

## 2021-06-25 ENCOUNTER — Ambulatory Visit (INDEPENDENT_AMBULATORY_CARE_PROVIDER_SITE_OTHER): Payer: BC Managed Care – PPO | Admitting: Sports Medicine

## 2021-06-25 VITALS — BP 120/80 | HR 78 | Ht 76.0 in | Wt 262.0 lb

## 2021-06-25 DIAGNOSIS — M48062 Spinal stenosis, lumbar region with neurogenic claudication: Secondary | ICD-10-CM | POA: Diagnosis not present

## 2021-06-25 DIAGNOSIS — G8929 Other chronic pain: Secondary | ICD-10-CM

## 2021-06-25 DIAGNOSIS — M5441 Lumbago with sciatica, right side: Secondary | ICD-10-CM | POA: Diagnosis not present

## 2021-06-25 DIAGNOSIS — M5416 Radiculopathy, lumbar region: Secondary | ICD-10-CM | POA: Diagnosis not present

## 2021-06-25 NOTE — Patient Instructions (Addendum)
Good to see you  ?Core and low back HEP  ?Epidural referral right side L5-S1 ?Follow up 2 week after your epidural to discuss results  ? ? ?

## 2021-07-08 ENCOUNTER — Inpatient Hospital Stay: Admission: RE | Admit: 2021-07-08 | Payer: BC Managed Care – PPO | Source: Ambulatory Visit

## 2021-07-13 ENCOUNTER — Telehealth: Payer: Self-pay | Admitting: Sports Medicine

## 2021-07-13 NOTE — Telephone Encounter (Signed)
Patient called stating that Dr Jean Rosenthal had ordered an epidural for him to have done but he received a letter from his insurance stating it was denied.  ?Do we have any further information or is there anything we can do to help get him scheduled? ? ?Please advise. ? ?

## 2021-07-14 NOTE — Telephone Encounter (Signed)
Received approval from patients insurance for the epidural. Approval number [id number]. Added this to the epidural order and informed patient.  ?

## 2021-07-22 ENCOUNTER — Inpatient Hospital Stay: Admission: RE | Admit: 2021-07-22 | Payer: BC Managed Care – PPO | Source: Ambulatory Visit

## 2021-07-23 NOTE — Discharge Instructions (Signed)

## 2021-07-24 ENCOUNTER — Ambulatory Visit
Admission: RE | Admit: 2021-07-24 | Discharge: 2021-07-24 | Disposition: A | Discharge status: Home / Self Care | Payer: BC Managed Care – PPO | Source: Ambulatory Visit | Attending: Sports Medicine | Admitting: Sports Medicine

## 2021-07-24 DIAGNOSIS — M47817 Spondylosis without myelopathy or radiculopathy, lumbosacral region: Secondary | ICD-10-CM | POA: Diagnosis not present

## 2021-07-24 DIAGNOSIS — M48062 Spinal stenosis, lumbar region with neurogenic claudication: Secondary | ICD-10-CM

## 2021-07-24 DIAGNOSIS — M5416 Radiculopathy, lumbar region: Secondary | ICD-10-CM

## 2021-07-24 DIAGNOSIS — G8929 Other chronic pain: Secondary | ICD-10-CM

## 2021-07-24 MED ORDER — IOPAMIDOL (ISOVUE-M 200) INJECTION 41%
1.0000 mL | Freq: Once | INTRAMUSCULAR | Status: AC
  Administered 2021-07-24: 1 mL via EPIDURAL

## 2021-07-24 MED ORDER — METHYLPREDNISOLONE ACETATE 40 MG/ML INJ SUSP (RADIOLOG
80.0000 mg | Freq: Once | INTRAMUSCULAR | Status: AC
Start: 2021-07-24 — End: 2021-07-24
  Administered 2021-07-24: 80 mg via EPIDURAL

## 2021-10-23 ENCOUNTER — Ambulatory Visit: Payer: BC Managed Care – PPO | Admitting: Family

## 2021-10-23 ENCOUNTER — Encounter: Payer: Self-pay | Admitting: Family

## 2021-10-23 VITALS — BP 124/68 | HR 65 | Temp 98.6°F | Ht 76.0 in | Wt 248.0 lb

## 2021-10-23 DIAGNOSIS — G8929 Other chronic pain: Secondary | ICD-10-CM

## 2021-10-23 DIAGNOSIS — M5441 Lumbago with sciatica, right side: Secondary | ICD-10-CM | POA: Diagnosis not present

## 2021-10-23 MED ORDER — PREGABALIN 50 MG PO CAPS: 50.0000 mg | ORAL_CAPSULE | Freq: Two times a day (BID) | ORAL | 0 refills | Status: DC

## 2021-10-23 MED ORDER — KETOROLAC TROMETHAMINE 60 MG/2ML IM SOLN
60.0000 mg | Freq: Once | INTRAMUSCULAR | Status: AC
  Administered 2021-10-23: 60 mg via INTRAMUSCULAR

## 2021-10-23 MED ORDER — PREDNISONE 10 MG PO TABS: ORAL_TABLET | ORAL | 0 refills | Status: DC

## 2021-10-23 NOTE — Progress Notes (Signed)
Patient ID: Ronald Mahoney, male    DOB: April 02, 1985, 36 y.o.   MRN: [id number]  Chief Complaint  Patient presents with   Back Pain    Pt c/o lower back pain has been getting worse for the past 3 days. Pt states he feels better standing than sitting, when he sits for a long time and driving makes it worse.  Pt would like a MRI done on his whole body. Has tried Methocarbamol which does help a little.     HPI: Chronic back pain:  has had 2 epidural spinal injections about a year apart and in 2 different areas and neither one has helped his pain. The last one was thru our sports med office and he states it lasted about 2 weeks. He says the provider wants to try and inject again in a different area, but he is reluctant to do this due to cost. He reports he gets some relief with the Methacarbamol and the Lyrica that his PCP gave him, but he was only taking the Lyrica qhs. States he wakes up in pain. Reports the Diclofenac pills did not help.  Assessment & Plan:  1. Chronic bilateral low back pain with right-sided sciatica - sending PT referral. Toradol injection given today. Sent refill for Lyrica today, increased dose & frequency, but advised to take at home first to be sure he is not too drowsy. Sent pred pack and advised on use & SE and to not start until tomorrow.  - Ambulatory referral to Physical Therapy - ketorolac (TORADOL) injection 60 mg - predniSONE (DELTASONE) 10 MG tablet; START SATURDAY:  6 tab day 1, 5 tab day 2-3, 4 tab day 4, 3 tab day 5  Dispense: 23 tablet; Refill: 0 - pregabalin (LYRICA) 50 MG capsule; Take 1 capsule (50 mg total) by mouth 2 (two) times daily.  Dispense: 60 capsule; Refill: 0  Subjective:    Outpatient Medications Prior to Visit  Medication Sig Dispense Refill   methocarbamol (ROBAXIN-750) 750 MG tablet Take 1 tablet (750 mg total) by mouth every 8 (eight) hours as needed for muscle spasms. 60 tablet 0   venlafaxine XR (EFFEXOR XR) 37.5 MG 24 hr capsule Take  1 capsule (37.5 mg total) by mouth daily with breakfast. 30 capsule 0   diclofenac (VOLTAREN) 75 MG EC tablet Take 1 twice daily with food for 2 weeks and then may use twice daily as needed. (Patient not taking: Reported on 10/23/2021) 60 tablet 0   pregabalin (LYRICA) 25 MG capsule May take 1-2 prior to bedtime. (Patient not taking: Reported on 10/23/2021) 60 capsule 0   No facility-administered medications prior to visit.   No past medical history on file. No past surgical history on file. No Known Allergies    Objective:    Physical Exam Vitals and nursing note reviewed.  Constitutional:      General: He is not in acute distress.    Appearance: Normal appearance.  HENT:     Head: Normocephalic.  Cardiovascular:     Rate and Rhythm: Normal rate and regular rhythm.  Pulmonary:     Effort: Pulmonary effort is normal.     Breath sounds: Normal breath sounds.  Musculoskeletal:        General: Normal range of motion.     Cervical back: Normal range of motion.  Skin:    General: Skin is warm and dry.  Neurological:     Mental Status: He is alert and oriented to person, place, and  time.  Psychiatric:        Mood and Affect: Mood normal.    BP 124/68 (BP Location: Left Arm, Patient Position: Sitting, Cuff Size: Large)   Pulse 65   Temp 98.6 F (37 C) (Temporal)   Ht 6\' 4"  (1.93 m)   Wt 248 lb (112.5 kg)   SpO2 97%   BMI 30.19 kg/m  Wt Readings from Last 3 Encounters:  10/23/21 248 lb (112.5 kg)  06/25/21 262 lb (118.8 kg)  06/04/21 256 lb (116.1 kg)       06/06/21, NP

## 2021-10-23 NOTE — Patient Instructions (Addendum)
It was very nice to see you today!   You received a strong anti-inflammatory shot today to help with your pain.  Wait to start the prednisone pack tomorrow am. Take this for 5 days.  I have also sent a refill on your Pregabalin (Lyrica) and increased the dose slightly, ok to take twice a day and follow up with Dr. Doreene Burke if this is helping. It is ok to take this with the Methocarbamol, or you spread it out and alternate taking the meds every 4 hours.       PLEASE NOTE:  If you had any lab tests please let us know if you have not heard back within a few days. You may see your results on MyChart before we have a chance to review them but we will give you a call once they are reviewed by [place]. If we ordered any referrals today, please let us know if you have not heard from their office within the next week.

## 2021-11-12 ENCOUNTER — Ambulatory Visit: Payer: BC Managed Care – PPO

## 2022-02-18 ENCOUNTER — Encounter: Payer: Self-pay | Admitting: Family Medicine

## 2022-02-18 ENCOUNTER — Ambulatory Visit: Payer: BC Managed Care – PPO | Admitting: Family Medicine

## 2022-02-18 VITALS — BP 122/68 | HR 75 | Temp 98.0°F | Ht 76.0 in | Wt 242.6 lb

## 2022-02-18 DIAGNOSIS — M5441 Lumbago with sciatica, right side: Secondary | ICD-10-CM | POA: Diagnosis not present

## 2022-02-18 MED ORDER — PREDNISONE 10 MG (48) PO TBPK: ORAL_TABLET | ORAL | 0 refills | Status: DC

## 2022-02-18 MED ORDER — KETOROLAC TROMETHAMINE 60 MG/2ML IM SOLN
60.0000 mg | Freq: Once | INTRAMUSCULAR | Status: AC
  Administered 2022-02-18: 60 mg via INTRAMUSCULAR

## 2022-02-18 MED ORDER — TRAMADOL HCL 50 MG PO TABS: ORAL_TABLET | ORAL | 0 refills | Status: DC

## 2022-02-18 NOTE — Progress Notes (Signed)
Established Patient Office Visit   Subjective:  Patient ID: Ronald Mahoney, male    DOB: Sep 17, 1985  Age: 36 y.o. MRN: [id number]  Chief Complaint  Patient presents with   Pain    Back pains, right hip pain with some lower abdominal pains becoming worst nothing seems to help.     HPI Encounter Diagnoses  Name Primary?   Right-sided low back pain with right-sided sciatica, unspecified chronicity Yes   3 to 4-day history of right lower back pain.  It has radiated down the side of his right leg and into his right groin area.  Pain is acute.  He is more comfortable standing.  He denies any numbness tingling or weakness in his lower extremities.  He is having no issues with stooling or urination.  This has been an ongoing issue for him.  No recent injury.  He works in [employer].  He has seen sports medicine in the past for this.  MRI of his lower back in June 2022 was reviewed.    Review of Systems  Constitutional: Negative.   HENT: Negative.    Eyes:  Negative for blurred vision, discharge and redness.  Respiratory: Negative.    Cardiovascular: Negative.   Gastrointestinal:  Negative for abdominal pain.  Genitourinary: Negative.   Musculoskeletal:  Positive for back pain and myalgias.  Skin:  Negative for rash.  Neurological:  Negative for tingling, loss of consciousness and weakness.  Endo/Heme/Allergies:  Negative for polydipsia.     Current Outpatient Medications:    methocarbamol (ROBAXIN-750) 750 MG tablet, Take 1 tablet (750 mg total) by mouth every 8 (eight) hours as needed for muscle spasms., Disp: 60 tablet, Rfl: 0   predniSONE (STERAPRED UNI-PAK 48 TAB) 10 MG (48) TBPK tablet, Please instruct 12 day dose pack., Disp: 48 tablet, Rfl: 0   traMADol (ULTRAM) 50 MG tablet, Take 1 at night as needed for pain., Disp: 15 tablet, Rfl: 0   diclofenac (VOLTAREN) 75 MG EC tablet, Take 1 twice daily with food for 2 weeks and then may use twice daily as needed. (Patient  not taking: Reported on 10/23/2021), Disp: 60 tablet, Rfl: 0   pregabalin (LYRICA) 50 MG capsule, Take 1 capsule (50 mg total) by mouth 2 (two) times daily. (Patient not taking: Reported on 02/18/2022), Disp: 60 capsule, Rfl: 0  Current Facility-Administered Medications:    ketorolac (TORADOL) injection 60 mg, 60 mg, Intramuscular, Once, Mliss Sax, MD   Objective:     BP 122/68 (BP Location: Right Arm, Patient Position: Sitting, Cuff Size: Large)   Pulse 75   Temp 98 F (36.7 C) (Temporal)   Ht 6\' 4"  (1.93 m)   Wt 242 lb 9.6 oz (110 kg)   SpO2 98%   BMI 29.53 kg/m    Physical Exam Constitutional:      General: He is not in acute distress.    Appearance: Normal appearance. He is not ill-appearing, toxic-appearing or diaphoretic.  HENT:     Head: Normocephalic and atraumatic.     Right Ear: External ear normal.     Left Ear: External ear normal.  Eyes:     General: No scleral icterus.       Right eye: No discharge.        Left eye: No discharge.     Extraocular Movements: Extraocular movements intact.     Conjunctiva/sclera: Conjunctivae normal.  Pulmonary:     Effort: Pulmonary effort is normal. No respiratory distress.  Musculoskeletal:  Lumbar back: No bony tenderness. Decreased range of motion. Positive right straight leg raise test. Negative left straight leg raise test.     Comments: Pain in the right lower back with straight leg raise on the right.  Skin:    General: Skin is warm and dry.  Neurological:     Mental Status: He is alert and oriented to person, place, and time.     Motor: No weakness.     Deep Tendon Reflexes:     Reflex Scores:      Patellar reflexes are 1+ on the right side and 1+ on the left side.      Achilles reflexes are 1+ on the right side and 1+ on the left side. Psychiatric:        Mood and Affect: Mood normal.        Behavior: Behavior normal.      No results found for any visits on 02/18/22.    The ASCVD Risk  score (Arnett DK, et al., 2019) failed to calculate for the following reasons:   The 2019 ASCVD risk score is only valid for ages 67 to 44    Assessment & Plan:   Right-sided low back pain with right-sided sciatica, unspecified chronicity -     Ketorolac Tromethamine -     Ambulatory referral to Orthopedics -     predniSONE; Please instruct 12 day dose pack.  Dispense: 48 tablet; Refill: 0 -     traMADol HCl; Take 1 at night as needed for pain.  Dispense: 15 tablet; Refill: 0    Return if symptoms worsen or fail to improve.  Will start a 12-day Dosepak.  Tramadol at all at night as needed.  Robaxin as needed for spasms.  He needs an orthopedic referral at this point.  Mliss Sax, MD

## 2022-02-22 ENCOUNTER — Telehealth: Payer: Self-pay | Admitting: Family Medicine

## 2022-02-22 NOTE — Telephone Encounter (Signed)
Pt had seen dr Doreene Burke last Thursday and pt stated he left without getting a note for work can you give him a back to work letter

## 2022-02-23 ENCOUNTER — Telehealth: Payer: Self-pay | Admitting: Family Medicine

## 2022-02-23 NOTE — Telephone Encounter (Signed)
Duplicate message returned patients call no answer LMTCB

## 2022-02-23 NOTE — Telephone Encounter (Signed)
Called patient for clarification of note/when did patient return to work. No answer LMTCB

## 2022-02-23 NOTE — Telephone Encounter (Signed)
Pt said you lvm to call you so he is returning your call

## 2022-02-24 NOTE — Telephone Encounter (Signed)
Note written and sent via Mychart per patients request.

## 2022-03-09 ENCOUNTER — Other Ambulatory Visit: Payer: Self-pay | Admitting: Family Medicine

## 2022-03-09 DIAGNOSIS — A6001 Herpesviral infection of penis: Secondary | ICD-10-CM

## 2022-03-26 ENCOUNTER — Telehealth: Payer: Self-pay | Admitting: Family Medicine

## 2022-03-26 NOTE — Telephone Encounter (Signed)
Pt stated he went to the [facility] ant patient stated the records can you fax his records to  [employer] . The fax  is [phone]. So the dr office can start the process

## 2022-03-30 NOTE — Telephone Encounter (Signed)
All records faxed with conformation.

## 2022-04-08 ENCOUNTER — Ambulatory Visit: Payer: BC Managed Care – PPO | Admitting: Family Medicine

## 2022-05-04 ENCOUNTER — Ambulatory Visit: Payer: BC Managed Care – PPO | Admitting: Family Medicine

## 2022-05-06 ENCOUNTER — Ambulatory Visit: Payer: BC Managed Care – PPO | Admitting: Family Medicine

## 2022-05-06 ENCOUNTER — Encounter: Payer: Self-pay | Admitting: Family Medicine

## 2022-05-06 VITALS — BP 122/76 | HR 74 | Temp 98.5°F | Ht 76.0 in | Wt 240.0 lb

## 2022-05-06 DIAGNOSIS — E0789 Other specified disorders of thyroid: Secondary | ICD-10-CM

## 2022-05-06 NOTE — Progress Notes (Signed)
Established Patient Office Visit   Subjective:  Patient ID: Ronald Mahoney, male    DOB: 04/04/1985  Age: 37 y.o. MRN: [id number]  Chief Complaint  Patient presents with   Neck Pain    Neck pain little tender to touch more painful with movement x 1 week.     Neck Pain  Pertinent negatives include no chest pain, fever, tingling or weakness.   Encounter Diagnoses  Name Primary?   Thyroid pain Yes   Reports a 2-week history of pain in his right anterior lower neck area.  There is been no injury.  Denies headache fevers chills sore throat cough.  Denies palpitations weight loss chest pain or shortness of breath.  No known family history of thyroid disease.   Review of Systems  Constitutional: Negative.  Negative for chills, diaphoresis and fever.  HENT: Negative.  Negative for congestion and sore throat.   Eyes:  Negative for blurred vision, discharge and redness.  Respiratory: Negative.  Negative for shortness of breath.   Cardiovascular: Negative.  Negative for chest pain and palpitations.  Gastrointestinal:  Negative for abdominal pain.  Genitourinary: Negative.   Musculoskeletal:  Positive for neck pain. Negative for myalgias.  Skin:  Negative for rash.  Neurological:  Negative for tingling, loss of consciousness and weakness.  Endo/Heme/Allergies:  Negative for polydipsia.     Current Outpatient Medications:    diclofenac (VOLTAREN) 75 MG EC tablet, Take 1 twice daily with food for 2 weeks and then may use twice daily as needed. (Patient not taking: Reported on 10/23/2021), Disp: 60 tablet, Rfl: 0   methocarbamol (ROBAXIN-750) 750 MG tablet, Take 1 tablet (750 mg total) by mouth every 8 (eight) hours as needed for muscle spasms. (Patient not taking: Reported on 05/06/2022), Disp: 60 tablet, Rfl: 0   predniSONE (STERAPRED UNI-PAK 48 TAB) 10 MG (48) TBPK tablet, Please instruct 12 day dose pack. (Patient not taking: Reported on 05/06/2022), Disp: 48 tablet, Rfl: 0   pregabalin  (LYRICA) 50 MG capsule, Take 1 capsule (50 mg total) by mouth 2 (two) times daily. (Patient not taking: Reported on 02/18/2022), Disp: 60 capsule, Rfl: 0   traMADol (ULTRAM) 50 MG tablet, Take 1 at night as needed for pain. (Patient not taking: Reported on 05/06/2022), Disp: 15 tablet, Rfl: 0   valACYclovir (VALTREX) 1000 MG tablet, TAKE 0.5 TABLETS (500 MG TOTAL) BY MOUTH 2 (TWO) TIMES DAILY FOR 3 DAYS. AS NEEDED FOR OUTBREAKS. (Patient not taking: Reported on 05/06/2022), Disp: 9 tablet, Rfl: 5   Objective:     BP 122/76 (BP Location: Right Arm, Patient Position: Sitting, Cuff Size: Normal)   Pulse 74   Temp 98.5 F (36.9 C) (Temporal)   Ht 6\' 4"  (1.93 m)   Wt 240 lb (108.9 kg)   SpO2 96%   BMI 29.21 kg/m    Physical Exam Constitutional:      General: He is not in acute distress.    Appearance: Normal appearance. He is not ill-appearing, toxic-appearing or diaphoretic.  HENT:     Head: Normocephalic and atraumatic.     Right Ear: External ear normal.     Left Ear: External ear normal.     Mouth/Throat:     Mouth: Mucous membranes are moist.     Pharynx: Oropharynx is clear. No oropharyngeal exudate or posterior oropharyngeal erythema.  Eyes:     General: No scleral icterus.       Right eye: No discharge.  Left eye: No discharge.     Extraocular Movements: Extraocular movements intact.     Conjunctiva/sclera: Conjunctivae normal.     Pupils: Pupils are equal, round, and reactive to light.  Neck:     Thyroid: Thyroid tenderness present. No thyroid mass or thyromegaly.     Comments: Tenderness to palpation of the right lobe of thyroid. Cardiovascular:     Rate and Rhythm: Normal rate and regular rhythm.  Pulmonary:     Effort: Pulmonary effort is normal. No respiratory distress.     Breath sounds: Normal breath sounds.  Abdominal:     General: Bowel sounds are normal.     Tenderness: There is no abdominal tenderness. There is no guarding.  Musculoskeletal:      Cervical back: No rigidity or tenderness.  Lymphadenopathy:     Cervical: No cervical adenopathy.  Skin:    General: Skin is warm and dry.  Neurological:     Mental Status: He is alert and oriented to person, place, and time.  Psychiatric:        Mood and Affect: Mood normal.        Behavior: Behavior normal.   CST thank you   No results found for any visits on 05/06/22.    The ASCVD Risk score (Arnett DK, et al., 2019) failed to calculate for the following reasons:   The 2019 ASCVD risk score is only valid for ages 60 to 25    Assessment & Plan:   Thyroid pain -     TSH -     T4, free -     Thyroid stimulating immunoglobulin    Return Follow-up recommendations pending results of today's labs.  May need ultrasound.Libby Maw, MD

## 2022-05-07 LAB — TSH: TSH: 0.95 u[IU]/mL (ref 0.35–5.50)

## 2022-05-07 LAB — T4, FREE: Free T4: 0.69 ng/dL (ref 0.60–1.60)

## 2022-05-10 DIAGNOSIS — M47896 Other spondylosis, lumbar region: Secondary | ICD-10-CM | POA: Diagnosis not present

## 2022-05-10 LAB — THYROID STIMULATING IMMUNOGLOBULIN: TSI: <89 % baseline (ref <140)

## 2022-05-17 ENCOUNTER — Encounter: Payer: Self-pay | Admitting: Family Medicine

## 2022-05-17 ENCOUNTER — Telehealth: Payer: Self-pay | Admitting: Family Medicine

## 2022-05-17 NOTE — Telephone Encounter (Signed)
Pt would like the results from his labs on 2/29. Please call. [phone]

## 2022-05-17 NOTE — Telephone Encounter (Signed)
Called pt and made aware of lab results, no further questions at the moment after confirming with pt that there is no where is his about getting an ultrasound done

## 2022-05-28 DIAGNOSIS — M5416 Radiculopathy, lumbar region: Secondary | ICD-10-CM | POA: Diagnosis not present

## 2022-05-28 DIAGNOSIS — M5451 Vertebrogenic low back pain: Secondary | ICD-10-CM | POA: Diagnosis not present

## 2022-06-03 DIAGNOSIS — M5451 Vertebrogenic low back pain: Secondary | ICD-10-CM | POA: Diagnosis not present

## 2022-06-03 DIAGNOSIS — M5416 Radiculopathy, lumbar region: Secondary | ICD-10-CM | POA: Diagnosis not present

## 2022-06-11 ENCOUNTER — Other Ambulatory Visit: Payer: Self-pay

## 2022-06-11 ENCOUNTER — Emergency Department (HOSPITAL_BASED_OUTPATIENT_CLINIC_OR_DEPARTMENT_OTHER)
Admission: EM | Admit: 2022-06-11 | Discharge: 2022-06-11 | Disposition: A | Discharge status: Home / Self Care | Payer: BC Managed Care – PPO | Attending: Emergency Medicine | Admitting: Emergency Medicine

## 2022-06-11 ENCOUNTER — Encounter (HOSPITAL_BASED_OUTPATIENT_CLINIC_OR_DEPARTMENT_OTHER): Payer: Self-pay | Admitting: Emergency Medicine

## 2022-06-11 DIAGNOSIS — K047 Periapical abscess without sinus: Secondary | ICD-10-CM | POA: Diagnosis not present

## 2022-06-11 DIAGNOSIS — K0889 Other specified disorders of teeth and supporting structures: Secondary | ICD-10-CM

## 2022-06-11 MED ORDER — KETOROLAC TROMETHAMINE 10 MG PO TABS: 10.0000 mg | ORAL_TABLET | Freq: Four times a day (QID) | ORAL | 0 refills | Status: DC | PRN

## 2022-06-11 MED ORDER — OXYCODONE-ACETAMINOPHEN 5-325 MG PO TABS
1.0000 | ORAL_TABLET | ORAL | Status: DC | PRN
  Administered 2022-06-11: 1 via ORAL
  Filled 2022-06-11: qty 1

## 2022-06-11 MED ORDER — BENZOCAINE 20 % MT AERO
INHALATION_SPRAY | Freq: Once | OROMUCOSAL | Status: AC
  Filled 2022-06-11: qty 57

## 2022-06-11 MED ORDER — AMOXICILLIN-POT CLAVULANATE 875-125 MG PO TABS: 1.0000 | ORAL_TABLET | Freq: Two times a day (BID) | ORAL | 0 refills | Status: DC

## 2022-06-11 NOTE — ED Notes (Signed)
Patient verbalizes understanding of discharge instructions. Opportunity for questioning and answers were provided. Patient discharged from ED.  °
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
## 2022-06-11 NOTE — ED Triage Notes (Signed)
Pt presents to ED POV. Pt c/o R dental pain. Pt reports that pain and swelling increasing today. Airway intact

## 2022-06-11 NOTE — Discharge Instructions (Addendum)
Use a peroxide mouthwash to with the infection.  Follow-up with your dentist.  I provided you resources if you are unable to get into your dentist.  Take the antibiotic as prescribed make sure you take it with food.  If you have worsening swelling, pain return to the ER.

## 2022-06-23 DIAGNOSIS — M5451 Vertebrogenic low back pain: Secondary | ICD-10-CM | POA: Diagnosis not present

## 2022-06-23 DIAGNOSIS — M5416 Radiculopathy, lumbar region: Secondary | ICD-10-CM | POA: Diagnosis not present

## 2022-09-06 DIAGNOSIS — L638 Other alopecia areata: Secondary | ICD-10-CM | POA: Diagnosis not present

## 2022-12-23 DIAGNOSIS — L638 Other alopecia areata: Secondary | ICD-10-CM | POA: Diagnosis not present

## 2022-12-23 DIAGNOSIS — L538 Other specified erythematous conditions: Secondary | ICD-10-CM | POA: Diagnosis not present

## 2023-01-30 ENCOUNTER — Other Ambulatory Visit: Payer: Self-pay | Admitting: Family Medicine

## 2023-01-30 DIAGNOSIS — A6001 Herpesviral infection of penis: Secondary | ICD-10-CM

## 2023-03-03 ENCOUNTER — Encounter: Payer: Self-pay | Admitting: Family Medicine

## 2023-03-03 ENCOUNTER — Ambulatory Visit: Payer: BC Managed Care – PPO | Admitting: Family Medicine

## 2023-03-03 VITALS — BP 130/72 | HR 70 | Temp 98.0°F | Ht 76.0 in | Wt 236.6 lb

## 2023-03-03 DIAGNOSIS — L720 Epidermal cyst: Secondary | ICD-10-CM

## 2023-03-03 IMAGING — DX DG LUMBAR SPINE COMPLETE 4+V
4 series · 4 of 4 positions shown · non-contrast
Comparison: None.

CLINICAL DATA: Bilateral low back pain without sciatica. No known
injury. Initial encounter.

EXAM:
LUMBAR SPINE - COMPLETE 4+ VIEW

[lumbar spine ap]
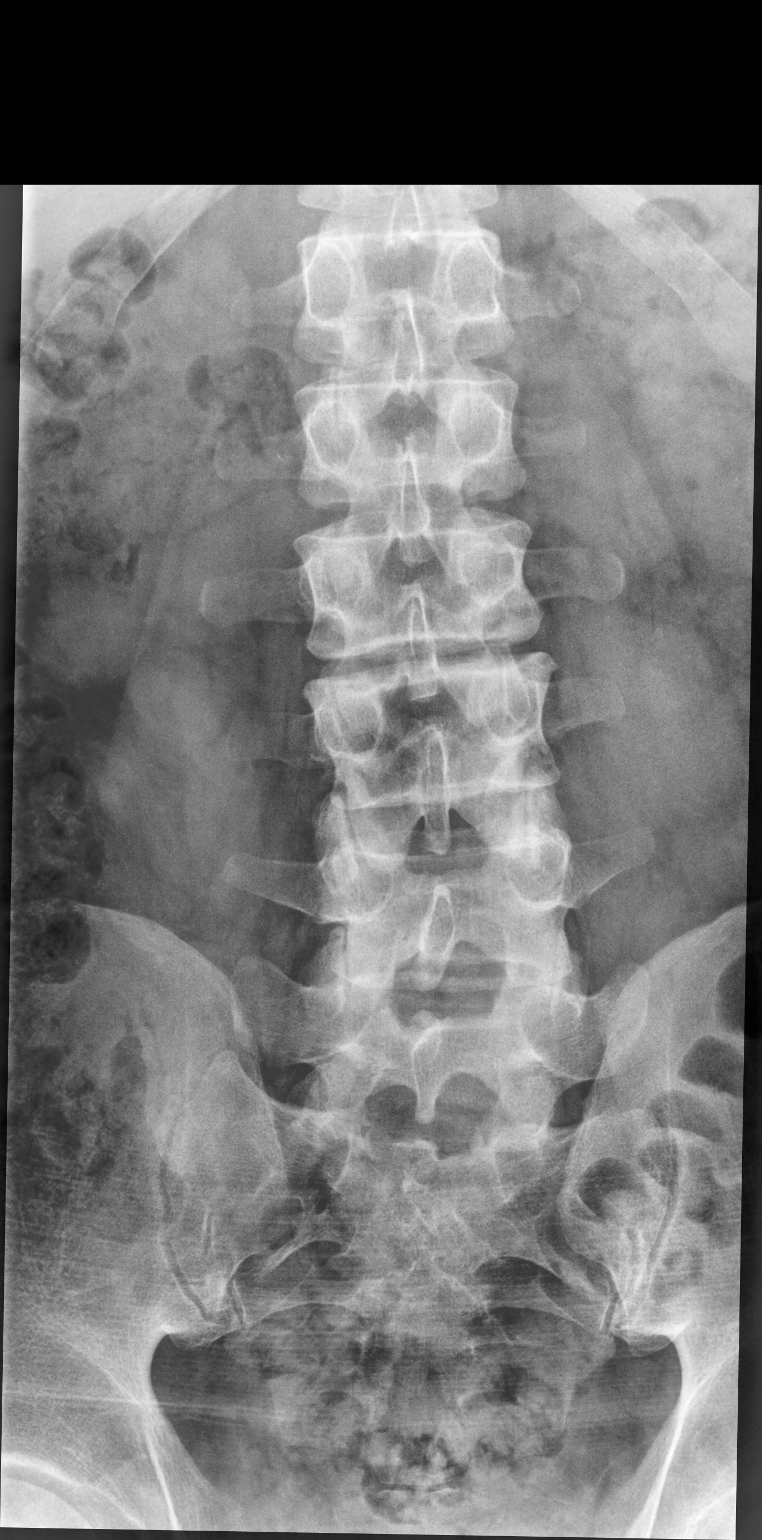

[lumbar spine lmo (1 of 2)]
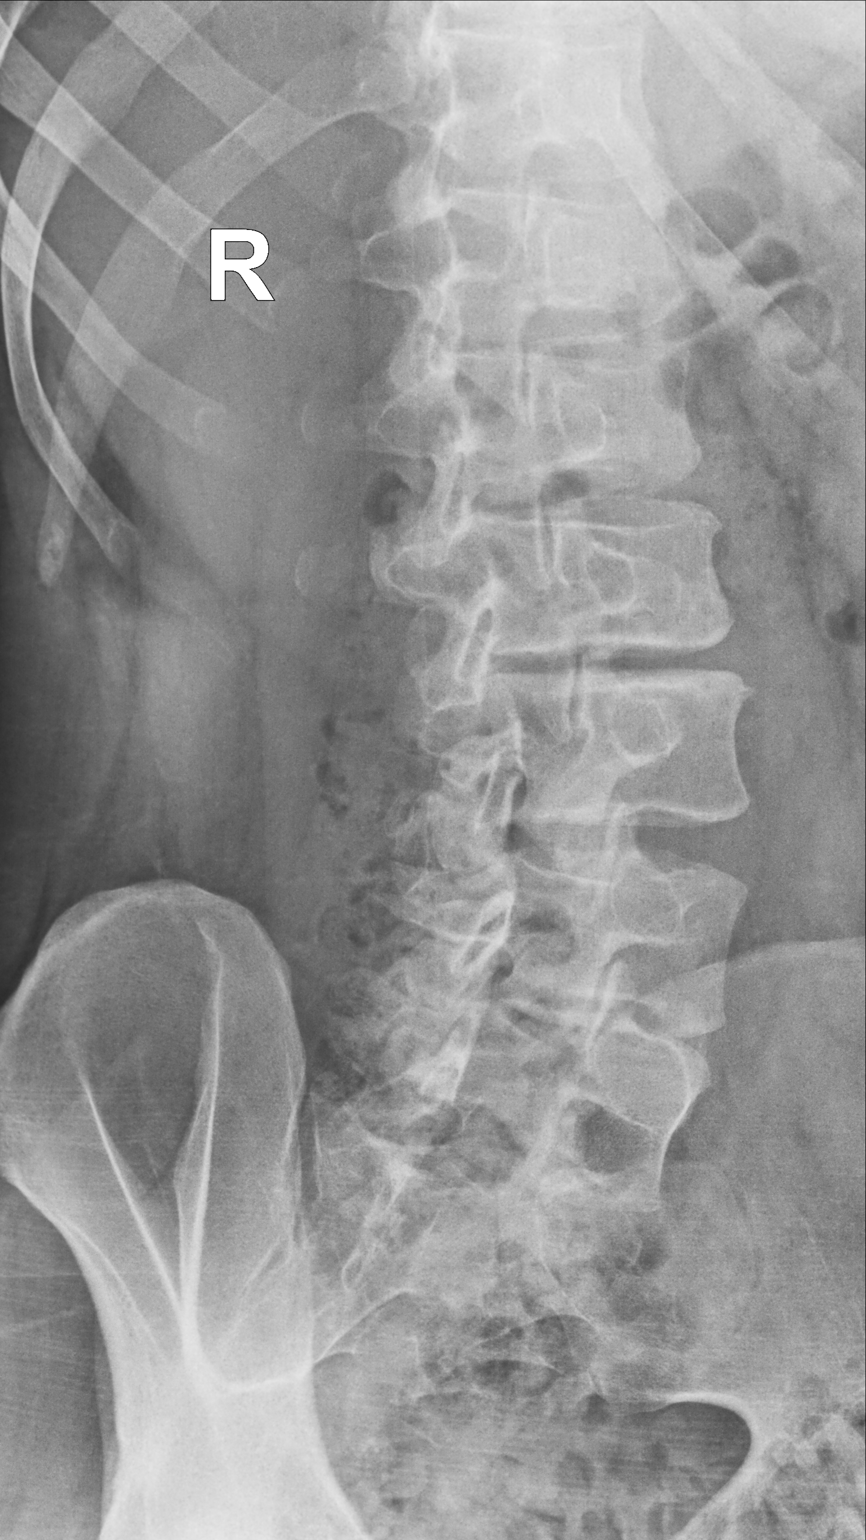

[lumbar spine lmo (2 of 2)]
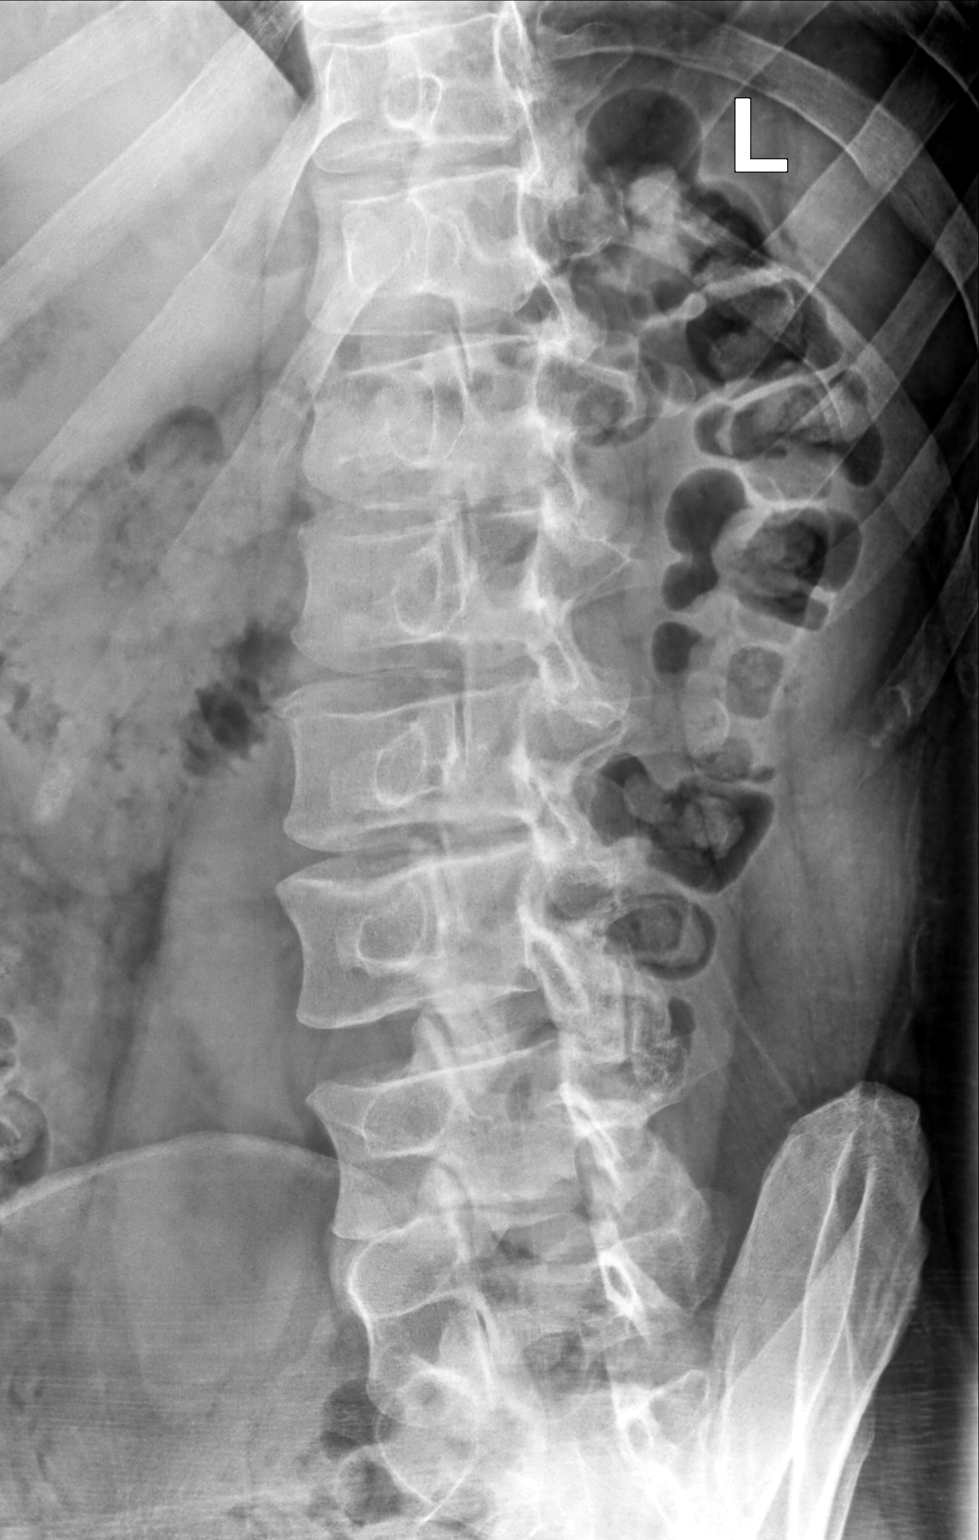

[lumbar spine lat]
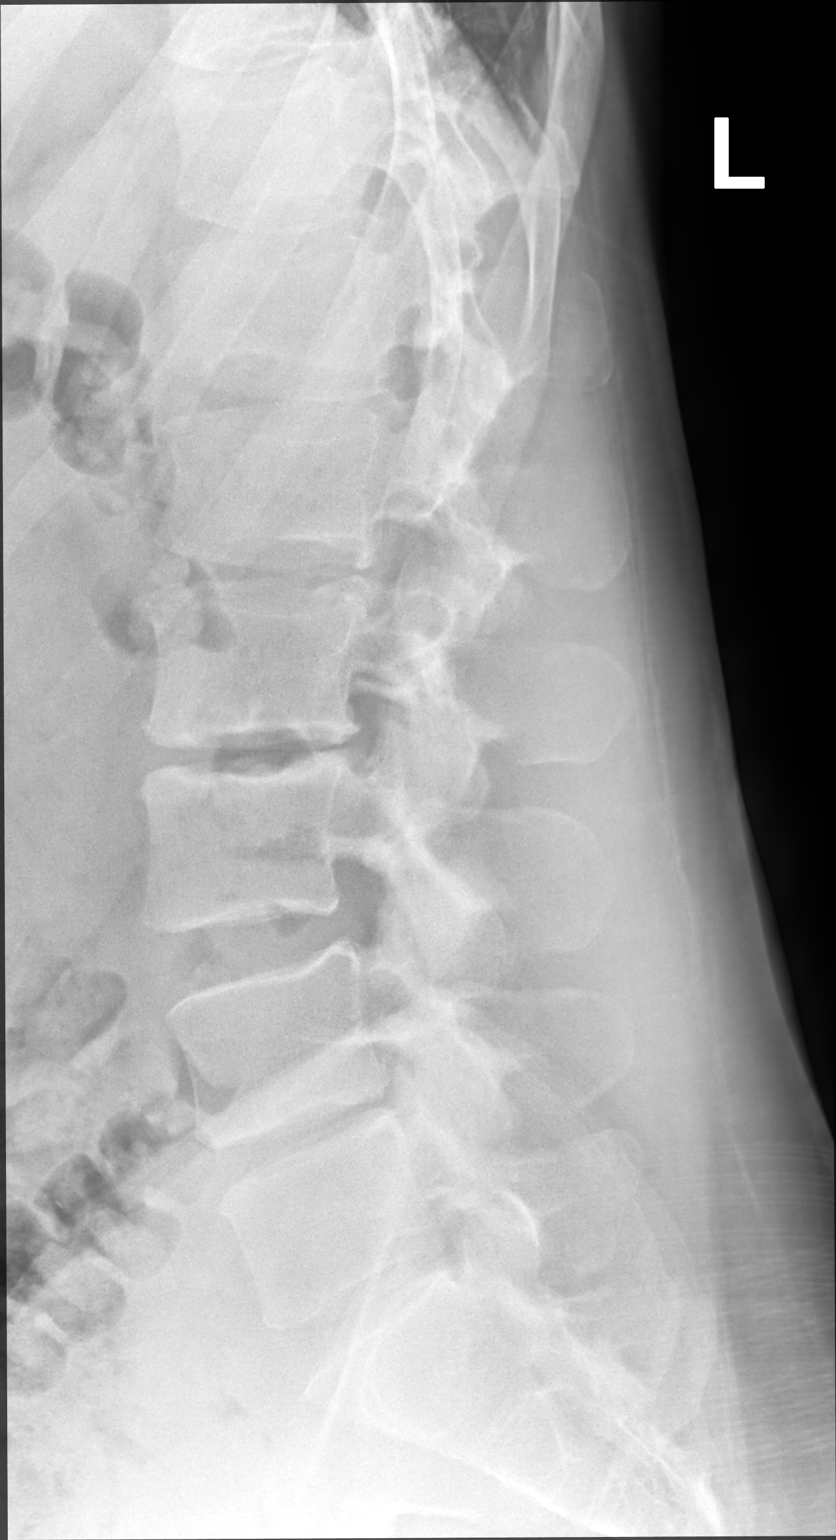

[4 of 4 positions shown; findings below may reference images not displayed]

FINDINGS: No acute fracture or subluxation identified.

Mild degenerative disc disease/spondylosis at L2-3 and L4-5 noted.

No focal bony lesions or spondylolysis noted.
IMPRESSION: Mild degenerative disc disease/spondylosis at L2-3 and L4-5.

## 2023-03-03 NOTE — Progress Notes (Signed)
   Established Patient Office Visit   Subjective:  Patient ID: Ronald Mahoney, male    DOB: 16-Dec-1985  Age: 36 y.o. MRN: [id number]  Chief Complaint  Patient presents with   Mass    C/o having a lump on middle back x months.      HPI Encounter Diagnoses  Name Primary?   Inclusion cyst Yes   Ongoing history of a lump in the middle part of his back.  It has been stable and has not changed as far as he knows.  There has been no discharge or drainage.  Has not been painful.  Review of Systems  Constitutional: Negative.   HENT: Negative.    Eyes:  Negative for blurred vision, discharge and redness.  Respiratory: Negative.    Cardiovascular: Negative.   Gastrointestinal:  Negative for abdominal pain.  Genitourinary: Negative.   Musculoskeletal: Negative.  Negative for myalgias.  Skin:  Negative for rash.  Neurological:  Negative for tingling, loss of consciousness and weakness.  Endo/Heme/Allergies:  Negative for polydipsia.     Current Outpatient Medications:    valACYclovir (VALTREX) 1000 MG tablet, TAKE 0.5 TABLETS (500 MG TOTAL) BY MOUTH 2 (TWO) TIMES DAILY FOR 3 DAYS. AS NEEDED FOR OUTBREAKS., Disp: 27 tablet, Rfl: 1   Objective:     BP 130/72   Pulse 70   Temp 98 F (36.7 C) (Temporal)   Ht 6\' 4"  (1.93 m)   Wt 236 lb 9.6 oz (107.3 kg)   SpO2 99%   BMI 28.80 kg/m    Physical Exam Constitutional:      General: He is not in acute distress.    Appearance: Normal appearance. He is not ill-appearing, toxic-appearing or diaphoretic.  HENT:     Head: Normocephalic and atraumatic.     Right Ear: External ear normal.     Left Ear: External ear normal.  Eyes:     General: No scleral icterus.       Right eye: No discharge.        Left eye: No discharge.     Extraocular Movements: Extraocular movements intact.     Conjunctiva/sclera: Conjunctivae normal.  Pulmonary:     Effort: Pulmonary effort is normal. No respiratory distress.  Skin:    General: Skin is warm  and dry.       Neurological:     Mental Status: He is alert and oriented to person, place, and time.  Psychiatric:        Mood and Affect: Mood normal.        Behavior: Behavior normal.      No results found for any visits on 03/03/23.    The ASCVD Risk score (Arnett DK, et al., 2019) failed to calculate for the following reasons:   The 2019 ASCVD risk score is only valid for ages 10 to 14    Assessment & Plan:   Inclusion cyst    Return if symptoms worsen or fail to improve.  We discussed a surgical referral and he would like to hold off for now.  May request that at any point.  Discussed signs and symptoms of infection and advised to return.  Advised to return with any changes.  Information was given about inclusion cyst.  Mliss Sax, MD

## 2023-05-03 DIAGNOSIS — L538 Other specified erythematous conditions: Secondary | ICD-10-CM | POA: Diagnosis not present

## 2023-05-03 DIAGNOSIS — L638 Other alopecia areata: Secondary | ICD-10-CM | POA: Diagnosis not present

## 2023-08-13 ENCOUNTER — Other Ambulatory Visit: Payer: Self-pay | Admitting: Family Medicine

## 2023-08-13 DIAGNOSIS — A6001 Herpesviral infection of penis: Secondary | ICD-10-CM

## 2024-02-12 ENCOUNTER — Other Ambulatory Visit: Payer: Self-pay | Admitting: Family Medicine

## 2024-02-12 DIAGNOSIS — A6001 Herpesviral infection of penis: Secondary | ICD-10-CM

## 2024-02-14 NOTE — Telephone Encounter (Signed)
 Refill request received for valacyclovir  1000mg  QNC:Wnwz LOV:03/03/2023 Last refill:08/15/2023 Medication is pending your approval. Pt may need OV. It hs been a year.

## 2024-04-02 IMAGING — XA Imaging study
2 series · 2 of 2 positions shown · non-contrast
Comparison: none

CLINICAL DATA: Lumbosacral spondylosis without myelopathy. Moderate
response to the epidural injection in [DATE]. Recurrent pain in the
low back and predominantly lateral right thigh. Right-sided L5-S1
epidural injection requested today. Transitional lumbosacral anatomy
with lumbarized S1.

[Series 1: ortho standard · 1 of 1 slices shown (1 of 2)]
[im 1/1]
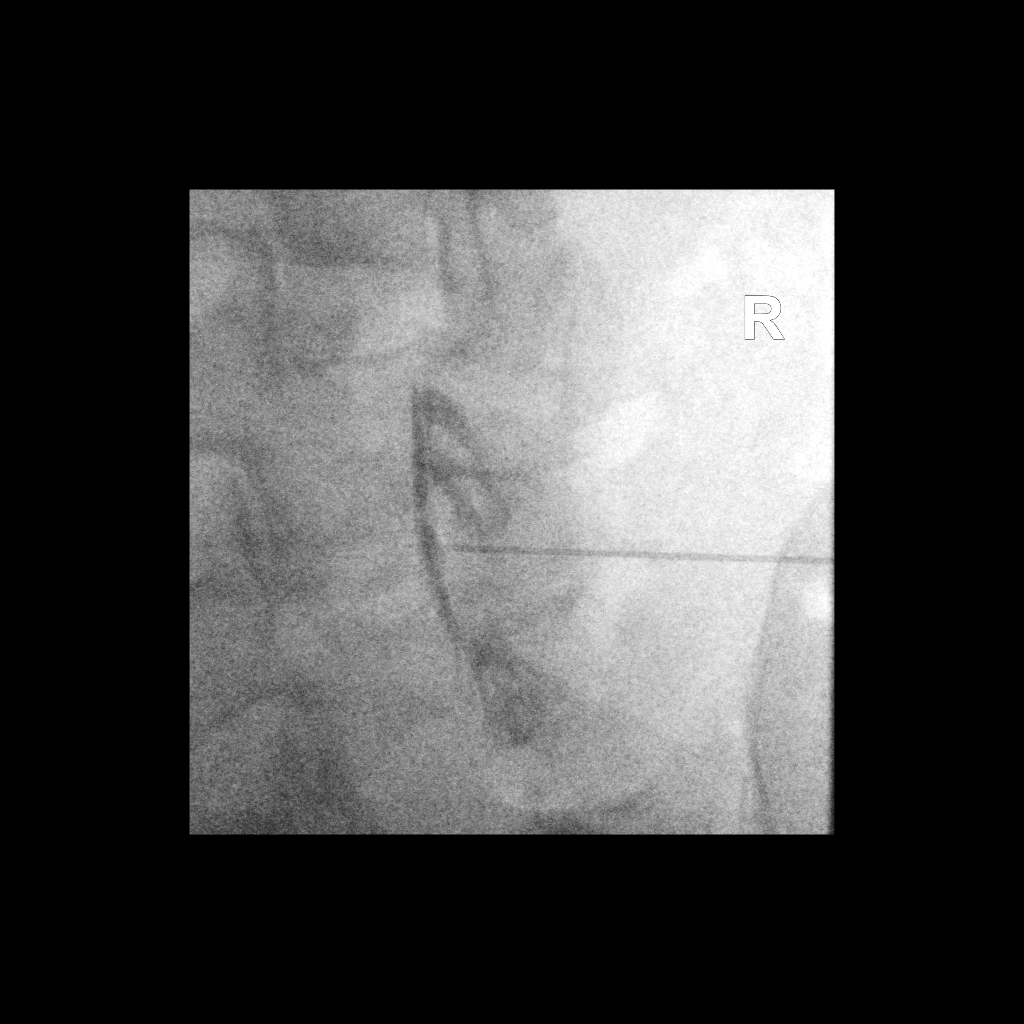

[Series 2: ortho standard · 1 of 1 slices shown (2 of 2)]
[im 1/1]
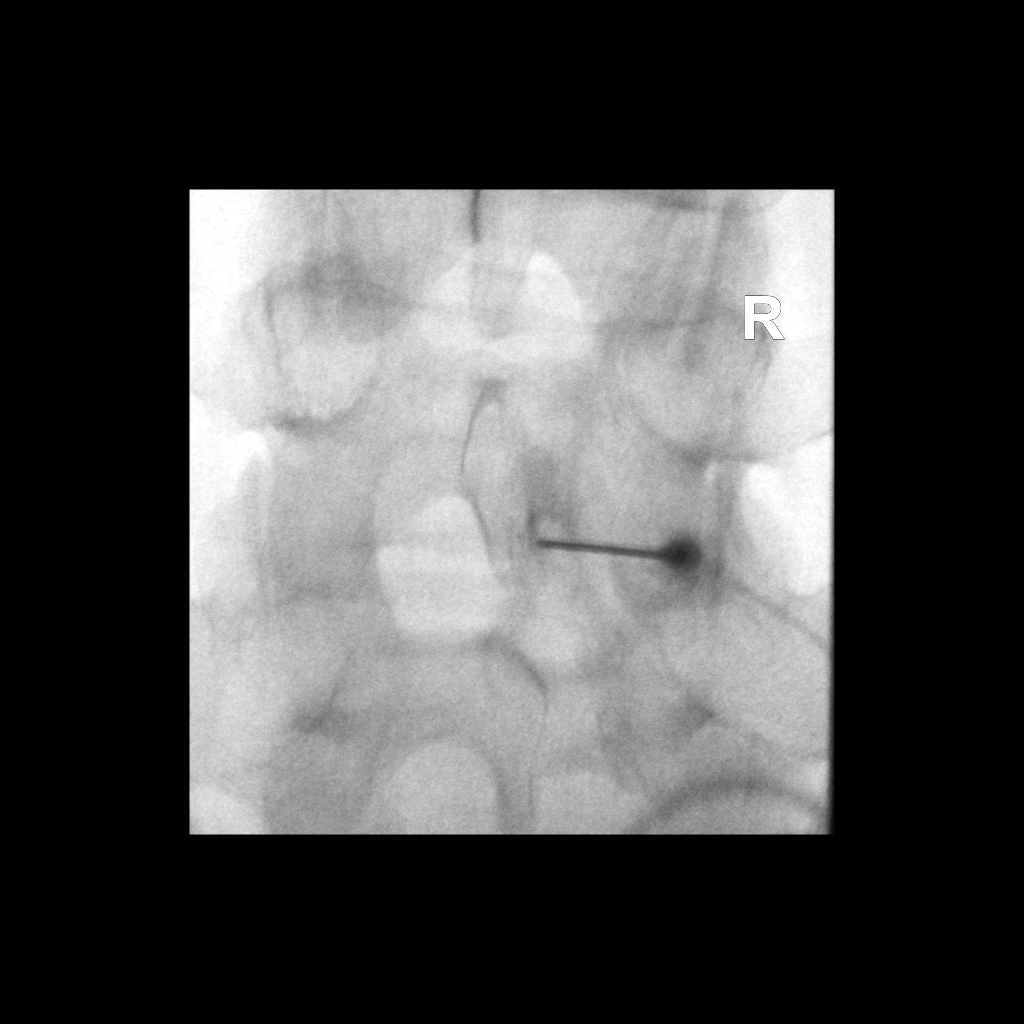

[2 of 2 positions shown; findings below may reference images not displayed]

FLUOROSCOPY:
Radiation Exposure Index (as provided by the fluoroscopic device):
2.70 mGy Kerma

PROCEDURE:
The procedure, risks, benefits, and alternatives were explained to
the patient. Questions regarding the procedure were encouraged and
answered. The patient understands and consents to the procedure.

LUMBAR EPIDURAL INJECTION:

An interlaminar approach was performed on the right at L5-S1. The
overlying skin was cleansed and anesthetized. A 3.5 inch 20 gauge
epidural needle was advanced using loss-of-resistance technique.

DIAGNOSTIC EPIDURAL INJECTION:

Injection of Isovue-M 200 shows a good epidural pattern with spread
above and below the level of needle placement, primarily on the
right. No vascular opacification is seen.

THERAPEUTIC EPIDURAL INJECTION:

80 mg of Depo-Medrol mixed with 3 mL of 1% lidocaine were instilled.
The procedure was well-tolerated, and the patient was discharged
thirty minutes following the injection in good condition.

COMPLICATIONS:
None immediate
IMPRESSION: Technically successful interlaminar epidural injection on the right
at L5-S1.
